# Patient Record
Sex: Male | Born: 1985 | Race: White | Hispanic: No | Marital: Single | Smoking: Never smoker
Health system: Southern US, Community
[De-identification: ages and names within clinical notes are randomized; demographics above are authoritative.]

## PROBLEM LIST (undated history)

## (undated) DIAGNOSIS — R569 Unspecified convulsions: Secondary | ICD-10-CM

## (undated) DIAGNOSIS — K654 Sclerosing mesenteritis: Secondary | ICD-10-CM

## (undated) DIAGNOSIS — M5020 Other cervical disc displacement, unspecified cervical region: Secondary | ICD-10-CM

## (undated) DIAGNOSIS — K2970 Gastritis, unspecified, without bleeding: Secondary | ICD-10-CM

## (undated) DIAGNOSIS — K2270 Barrett's esophagus without dysplasia: Secondary | ICD-10-CM

## (undated) HISTORY — PX: MANDIBLE SURGERY: SHX707

---

## 2015-08-02 ENCOUNTER — Encounter: Payer: Self-pay | Admitting: Physician Assistant

## 2015-08-02 ENCOUNTER — Ambulatory Visit (INDEPENDENT_AMBULATORY_CARE_PROVIDER_SITE_OTHER): Payer: Self-pay | Admitting: Family Medicine

## 2015-08-02 VITALS — BP 104/60 | HR 69 | Temp 97.9°F | Resp 18 | Ht 69.5 in | Wt 190.2 lb

## 2015-08-02 DIAGNOSIS — Y990 Civilian activity done for income or pay: Secondary | ICD-10-CM

## 2015-08-02 DIAGNOSIS — H5711 Ocular pain, right eye: Secondary | ICD-10-CM

## 2015-08-02 DIAGNOSIS — H1189 Other specified disorders of conjunctiva: Secondary | ICD-10-CM

## 2015-08-02 MED ORDER — OFLOXACIN 0.3 % OP SOLN: OPHTHALMIC | Status: DC

## 2015-08-02 NOTE — Progress Notes (Signed)
Patient ID: Don Dixon, male    DOB: 1986-05-27  Age: 30 y.o. MRN: [id number]  Chief Complaint  Patient presents with  . 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 He rinsed it out, but didn't have a good eye wash station. He continued to work. This morning his eye was matted shut with some sticky drainage.He came to the office for evaluation. His father's his boss and this was not filed on [employer] but is a work-related injury.  Current allergies, medications, problem list, past/family and social histories reviewed.  Objective:  BP 104/60 mmHg  Pulse 69  Temp(Src) 97.9 F (36.6 C) (Oral)  Resp 18  Ht 5' 9.5" (1.765 m)  Wt 190 lb 3.2 oz (86.274 kg)  BMI 27.69 kg/m2  SpO2 98%  Eyes PERRLA. Fundi benign.No foreign body noted. Lid everted andno foreign bodies noted. Eye was irrigated well after applying fluorescein.Blacklight examination did not reveal any abrasions. There is a little uptake on the lateral aspect of the conjunctiva.No lesion or foreign body noted there.  Assessment & Plan:   Assessment: 1. Eye pain, right   2. Work related injury   3. Conjunctival irritation       Plan: Will cover with antibiotics.Recheck at anytime if worse Plan to return in 2 days for a follow-up visit.  No orders of the defined types were placed in this encounter.    Meds ordered this encounter  Medications  . ofloxacin (OCUFLOX) 0.3 % ophthalmic solution    Sig: Se 1-2 drops in right eye every 3-4 hours as directed    Dispense:  5 mL    Refill:  0         Patient Instructions  Return Friday for a follow-up visit.  If worse or not improving at all by tomorrow morning please return tomorrow so we can see if referral to a specialist is needed before we get into the weekend  Use the ofloxacin eye drops1 or 2 drops in right eye every 3-4 hours  for 24 hours, then decrease to 4 times daily.      No Follow-up on file.   HOPPER,DAVID, MD 08/02/2015

## 2015-08-02 NOTE — Patient Instructions (Signed)
Return Friday for a follow-up visit.  If worse or not improving at all by tomorrow morning please return tomorrow so we can see if referral to a specialist is needed before we get into the weekend  Use the ofloxacin eye drops1 or 2 drops in right eye every 3-4 hours for 24 hours, then decrease to 4 times daily.

## 2021-10-16 ENCOUNTER — Encounter (HOSPITAL_BASED_OUTPATIENT_CLINIC_OR_DEPARTMENT_OTHER): Payer: Self-pay | Admitting: Emergency Medicine

## 2021-10-16 ENCOUNTER — Emergency Department (HOSPITAL_BASED_OUTPATIENT_CLINIC_OR_DEPARTMENT_OTHER): Payer: Non-veteran care

## 2021-10-16 ENCOUNTER — Other Ambulatory Visit: Payer: Self-pay | Admitting: General Surgery

## 2021-10-16 ENCOUNTER — Observation Stay (HOSPITAL_BASED_OUTPATIENT_CLINIC_OR_DEPARTMENT_OTHER)
Admission: EM | Admit: 2021-10-16 | Discharge: 2021-10-17 | Disposition: A | Discharge status: Home / Self Care | Payer: Non-veteran care | Attending: General Surgery | Admitting: General Surgery

## 2021-10-16 DIAGNOSIS — R1031 Right lower quadrant pain: Secondary | ICD-10-CM | POA: Diagnosis present

## 2021-10-16 DIAGNOSIS — K3580 Unspecified acute appendicitis: Principal | ICD-10-CM | POA: Insufficient documentation

## 2021-10-16 DIAGNOSIS — K37 Unspecified appendicitis: Secondary | ICD-10-CM

## 2021-10-16 HISTORY — DX: Barrett's esophagus without dysplasia: K22.70

## 2021-10-16 HISTORY — DX: Other cervical disc displacement, unspecified cervical region: M50.20

## 2021-10-16 HISTORY — DX: Gastritis, unspecified, without bleeding: K29.70

## 2021-10-16 HISTORY — DX: Unspecified convulsions: R56.9

## 2021-10-16 HISTORY — DX: Sclerosing mesenteritis: K65.4

## 2021-10-16 LAB — CBC WITH DIFFERENTIAL/PLATELET
Abs Immature Granulocytes: 0.03 10*3/uL (ref 0.00–0.07)
Basophils Absolute: 0 10*3/uL (ref 0.0–0.1)
Basophils Relative: 0 %
Eosinophils Absolute: 0.1 10*3/uL (ref 0.0–0.5)
Eosinophils Relative: 1 %
HCT: 42.2 % (ref 39.0–52.0)
Hemoglobin: 14.8 g/dL (ref 13.0–17.0)
Immature Granulocytes: 0 %
Lymphocytes Relative: 13 %
Lymphs Abs: 1.8 10*3/uL (ref 0.7–4.0)
MCH: 30.3 pg (ref 26.0–34.0)
MCHC: 35.1 g/dL (ref 30.0–36.0)
MCV: 86.3 fL (ref 80.0–100.0)
Monocytes Absolute: 1 10*3/uL (ref 0.1–1.0)
Monocytes Relative: 7 %
Neutro Abs: 11.7 10*3/uL — ABNORMAL HIGH (ref 1.7–7.7)
Neutrophils Relative %: 79 %
Platelets: 232 10*3/uL (ref 150–400)
RBC: 4.89 MIL/uL (ref 4.22–5.81)
RDW: 12 % (ref 11.5–15.5)
WBC: 14.6 10*3/uL — ABNORMAL HIGH (ref 4.0–10.5)
nRBC: 0 % (ref 0.0–0.2)

## 2021-10-16 LAB — COMPREHENSIVE METABOLIC PANEL
ALT: 25 U/L (ref 0–44)
AST: 20 U/L (ref 15–41)
Albumin: 4.4 g/dL (ref 3.5–5.0)
Alkaline Phosphatase: 87 U/L (ref 38–126)
Anion gap: 7 (ref 5–15)
BUN: 17 mg/dL (ref 6–20)
CO2: 27 mmol/L (ref 22–32)
Calcium: 9 mg/dL (ref 8.9–10.3)
Chloride: 104 mmol/L (ref 98–111)
Creatinine, Ser: 1.27 mg/dL — ABNORMAL HIGH (ref 0.61–1.24)
GFR, Estimated: >60 mL/min (ref >60)
Glucose, Bld: 96 mg/dL (ref 70–99)
Potassium: 4.2 mmol/L (ref 3.5–5.1)
Sodium: 138 mmol/L (ref 135–145)
Total Bilirubin: 0.6 mg/dL (ref 0.3–1.2)
Total Protein: 7.5 g/dL (ref 6.5–8.1)

## 2021-10-16 LAB — LIPASE, BLOOD: Lipase: 30 U/L (ref 11–51)

## 2021-10-16 MED ORDER — CEFTRIAXONE SODIUM 2 G IJ SOLR
2.0000 g | Freq: Once | INTRAMUSCULAR | Status: AC
Start: 2021-10-16 — End: 2021-10-17
  Administered 2021-10-16: 2 g via INTRAVENOUS
  Filled 2021-10-16: qty 20

## 2021-10-16 MED ORDER — LACTATED RINGERS IV BOLUS
1000.0000 mL | Freq: Once | INTRAVENOUS | Status: AC
Start: 2021-10-16 — End: 2021-10-16
  Administered 2021-10-16: 1000 mL via INTRAVENOUS

## 2021-10-16 MED ORDER — IOHEXOL 300 MG/ML  SOLN
100.0000 mL | Freq: Once | INTRAMUSCULAR | Status: AC | PRN
Start: 2021-10-16 — End: 2021-10-16
  Administered 2021-10-16: 100 mL via INTRAVENOUS

## 2021-10-16 MED ORDER — LACTATED RINGERS IV BOLUS
1000.0000 mL | Freq: Once | INTRAVENOUS | Status: AC
  Administered 2021-10-16: 1000 mL via INTRAVENOUS

## 2021-10-16 MED ORDER — ONDANSETRON HCL 4 MG/2ML IJ SOLN
4.0000 mg | Freq: Once | INTRAMUSCULAR | Status: AC
  Administered 2021-10-16: 4 mg via INTRAVENOUS
  Filled 2021-10-16: qty 2

## 2021-10-16 MED ORDER — METRONIDAZOLE 500 MG/100ML IV SOLN
500.0000 mg | Freq: Three times a day (TID) | INTRAVENOUS | Status: DC
Start: 2021-10-16 — End: 2021-10-17
  Administered 2021-10-17: 500 mg via INTRAVENOUS
  Filled 2021-10-16 (×2): qty 100

## 2021-10-16 MED ORDER — HYDROMORPHONE HCL 1 MG/ML IJ SOLN
1.0000 mg | Freq: Once | INTRAMUSCULAR | Status: AC
  Administered 2021-10-16: 1 mg via INTRAVENOUS
  Filled 2021-10-16: qty 1

## 2021-10-16 NOTE — ED Provider Notes (Signed)
?[facility] EMERGENCY DEPARTMENT ?Provider Note ? ? ?CSN: [id number] ?Arrival date & time: 10/16/21  2102 ? ?  ? ?History ?Chief Complaint  ?Patient presents with  ? Abdominal Pain  ? ? ?Don Dixon is a 36 y.o. male with history of gastritis, sclerosing mesentery-itis presents emergency department for evaluation of right lower quadrant pain onset 1000 today.  Patient reports he had some nausea without any vomiting.  Denies any dysuria hematuria.  He reports has been constipated for the past few days but denies any diarrhea.  Patient is able to pass gas.  Denies any fever, chest pain, or shortness of breath.  No medication taken prior to arrival for pain.  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  He has had endoscopies and jaw surgery, but denies any abdominal surgeries. ? ? ?Abdominal Pain ?Associated symptoms: constipation and nausea   ?Associated symptoms: no chest pain, no chills, no diarrhea, no dysuria, no fever, no hematuria, no shortness of breath and no vomiting   ? ?  ? ?Home Medications ?Prior to Admission medications   ?Medication Sig Start Date End Date Taking? Authorizing Provider  ?ofloxacin (OCUFLOX) 0.3 % ophthalmic solution Se 1-2 drops in right eye every 3-4 hours as directed 08/02/15   Don Najjar, MD  ?   ? ?Allergies    ?Patient has no known allergies.   ? ?Review of Systems   ?Review of Systems  ?Constitutional:  Negative for chills and fever.  ?Respiratory:  Negative for shortness of breath.   ?Cardiovascular:  Negative for chest pain.  ?Gastrointestinal:  Positive for abdominal pain, constipation and nausea. Negative for diarrhea and vomiting.  ?Genitourinary:  Negative for dysuria and hematuria.  ?Musculoskeletal:  Negative for back pain.  ? ?Physical Exam ?Updated Vital Signs ?BP (!) 135/91   Pulse 75   Temp 98.5 ?F (36.9 ?C) (Oral)   Resp 18   Wt 90.7 kg   SpO2 100%   BMI 29.11 kg/m?  ?Physical Exam ?Vitals and nursing note  reviewed.  ?Constitutional:   ?   Appearance: Normal appearance. He is ill-appearing.  ?HENT:  ?   Head: Normocephalic and atraumatic.  ?Eyes:  ?   General: No scleral icterus. ?Cardiovascular:  ?   Rate and Rhythm: Normal rate and regular rhythm.  ?Pulmonary:  ?   Effort: Pulmonary effort is normal. No respiratory distress.  ?   Breath sounds: Normal breath sounds.  ?Abdominal:  ?   General: Bowel sounds are normal. There is no distension.  ?   Palpations: Abdomen is soft.  ?   Tenderness: There is abdominal tenderness. There is guarding. There is no rebound. Negative signs include Rovsing's sign.  ?   Comments: Diffuse abdominal tenderness but mainly over the right upper and right lower quadrant.  There is voluntary guarding.  No rebound tenderness pain.  Normal active bowel sounds.  No overlying skin changes noted other than tattoo.  I did not visualize any abdominal surgical scars.  ?Musculoskeletal:     ?   General: No deformity.  ?   Cervical back: Normal range of motion.  ?Skin: ?   General: Skin is warm and dry.  ?Neurological:  ?   General: No focal deficit present.  ?   Mental Status: He is alert. Mental status is at baseline.  ? ? ?ED Results / Procedures / Treatments   ?Labs ?(all labs ordered are listed, but only abnormal results are  displayed) ?Labs Reviewed  ?COMPREHENSIVE METABOLIC PANEL - Abnormal; Notable for the following components:  ?    Result Value  ? Creatinine, Ser 1.27 (*)   ? All other components within normal limits  ?CBC WITH DIFFERENTIAL/PLATELET - Abnormal; Notable for the following components:  ? WBC 14.6 (*)   ? Neutro Abs 11.7 (*)   ? All other components within normal limits  ?LIPASE, BLOOD  ? ? ?EKG ?None ? ?Radiology ?CT ABDOMEN PELVIS W CONTRAST ? ?Result Date: 10/16/2021 ?CLINICAL DATA:  Right-sided abdominal pain, nausea since this morning, right lower quadrant tenderness EXAM: CT ABDOMEN AND PELVIS WITH CONTRAST TECHNIQUE: Multidetector CT imaging of the abdomen and pelvis was  performed using the standard protocol following bolus administration of intravenous contrast. RADIATION DOSE REDUCTION: This exam was performed according to the departmental dose-optimization program which includes automated exposure control, adjustment of the mA and/or kV according to patient size and/or use of iterative reconstruction technique. CONTRAST:  100mL OMNIPAQUE IOHEXOL 300 MG/ML  SOLN COMPARISON:  None. FINDINGS: Lower chest: No acute pleural or parenchymal lung disease. Hepatobiliary: Small hepatic hypodensities most compatible with cysts. No biliary duct dilation. The gallbladder is unremarkable. Pancreas: Unremarkable. No pancreatic ductal dilatation or surrounding inflammatory changes. Spleen: Normal in size without focal abnormality. Adrenals/Urinary Tract: Nonobstructing 2 mm right renal calculus reference image 32/2. No left-sided calculi. No obstructive uropathy within either kidney. The adrenals and bladder are unremarkable. Stomach/Bowel: Dilated appendix right lower quadrant measures 12 mm, with multiple small appendicoliths and periappendiceal fat stranding consistent with acute appendicitis. No perforation, fluid collection, or abscess. No bowel obstruction or ileus.  Small hiatal hernia. Vascular/Lymphatic: No significant vascular findings. No discrete pathologic adenopathy within the abdomen or pelvis. Reproductive: Prostate is unremarkable. Other: There is a multilobular mass within the central mesentery, and measuring up to 4.0 x 2.9 x 7.8 cm. The mass is multilobular and circumscribed, without associated desmoplastic reaction or bowel retraction. No free fluid or free intraperitoneal gas. No abdominal wall hernia. Musculoskeletal: No acute or destructive bony lesions. Reconstructed images demonstrate no additional findings. IMPRESSION: 1. Acute uncomplicated appendicitis, with multiple appendicoliths. No perforation, fluid collection, or abscess. 2. Nonobstructing 2 mm right renal  calculus. 3. 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 No desmoplastic reaction or bowel retraction. This may reflect a calcified fibrous tumor of the mesentery, though carcinoid tumor and lymphoma cannot be excluded. Direct visualization and possible biopsy could be performed time of appendectomy. 4. Small hiatal hernia. Electronically Signed   By: Sharlet SalinaMichael  Brown M.D.   On: 10/16/2021 22:30   ? ?Procedures ?Procedures  ? ?Medications Ordered in ED ?Medications  ?cefTRIAXone (ROCEPHIN) 2 g in sodium chloride 0.9 % 100 mL IVPB (has no administration in time range)  ?metroNIDAZOLE (FLAGYL) IVPB 500 mg (has no administration in time range)  ?iohexol (OMNIPAQUE) 300 MG/ML solution 100 mL (100 mLs Intravenous Contrast Given 10/16/21 2156)  ?lactated ringers bolus 1,000 mL (1,000 mLs Intravenous New Bag/Given 10/16/21 2226)  ?lactated ringers bolus 1,000 mL (1,000 mLs Intravenous New Bag/Given 10/16/21 2307)  ?HYDROmorphone (DILAUDID) injection 1 mg (1 mg Intravenous Given 10/16/21 2305)  ?ondansetron [facility](ZOFRAN) injection 4 mg (4 mg Intravenous Given 10/16/21 2306)  ? ? ?ED Course/ Medical Decision Making/ A&P ?  ?                        ?Medical Decision Making ?Amount and/or Complexity of Data Reviewed ?Labs: ordered. ?Radiology: ordered. ? ?Risk ?Prescription drug management. ?  Decision regarding hospitalization. ? ? ?Don Dixon is a 36 year old male presenting to the emergency department for evaluation of right lower quadrant pain starting this morning around 1000.  Differential diagnosis includes was not limited to diverticulitis, constipation, SBO, appendicitis, viral gastroenteritis.  Vital signs show mild hypertension at 138/95, afebrile, normal pulse rate, satting well on room air without any increased work of breathing.  Not concerned for sepsis given vitals.  Physical exam is pertinent for diffuse abdominal tenderness but mainly over the right upper and right lower quadrant.  There is  voluntary guarding.  No rebound tenderness pain.  Normal active bowel sounds.  No overlying skin changes noted other than tattoo.  I did not visualize any abdominal surgical scars. ? ?We will order labs and CT imagi

## 2021-10-16 NOTE — ED Triage Notes (Signed)
Pt having rightsided pain that started this morning and has progressed in pain throughout day. He endorses nausea. No diarrhea. Last BM was this morning. Hx of esophogeal issues and hiatal hernia ?

## 2021-10-17 ENCOUNTER — Other Ambulatory Visit: Payer: Self-pay

## 2021-10-17 ENCOUNTER — Encounter (HOSPITAL_COMMUNITY)
Admission: EM | Disposition: A | Discharge status: Home / Self Care | Payer: Self-pay | Source: Home / Self Care | Attending: Emergency Medicine

## 2021-10-17 ENCOUNTER — Observation Stay (HOSPITAL_BASED_OUTPATIENT_CLINIC_OR_DEPARTMENT_OTHER): Payer: Non-veteran care | Admitting: Anesthesiology

## 2021-10-17 ENCOUNTER — Encounter (HOSPITAL_COMMUNITY): Payer: Self-pay

## 2021-10-17 ENCOUNTER — Observation Stay (HOSPITAL_COMMUNITY): Payer: Non-veteran care | Admitting: Anesthesiology

## 2021-10-17 DIAGNOSIS — R1031 Right lower quadrant pain: Secondary | ICD-10-CM | POA: Diagnosis present

## 2021-10-17 DIAGNOSIS — K3580 Unspecified acute appendicitis: Secondary | ICD-10-CM | POA: Diagnosis present

## 2021-10-17 DIAGNOSIS — K37 Unspecified appendicitis: Secondary | ICD-10-CM | POA: Diagnosis present

## 2021-10-17 HISTORY — PX: LAPAROSCOPIC APPENDECTOMY: SHX408

## 2021-10-17 LAB — CBC
HCT: 38.7 % — ABNORMAL LOW (ref 39.0–52.0)
Hemoglobin: 13.4 g/dL (ref 13.0–17.0)
MCH: 30 pg (ref 26.0–34.0)
MCHC: 34.6 g/dL (ref 30.0–36.0)
MCV: 86.6 fL (ref 80.0–100.0)
Platelets: 178 10*3/uL (ref 150–400)
RBC: 4.47 MIL/uL (ref 4.22–5.81)
RDW: 12.1 % (ref 11.5–15.5)
WBC: 14.5 10*3/uL — ABNORMAL HIGH (ref 4.0–10.5)
nRBC: 0 % (ref 0.0–0.2)

## 2021-10-17 LAB — BASIC METABOLIC PANEL
Anion gap: 7 (ref 5–15)
BUN: 9 mg/dL (ref 6–20)
CO2: 25 mmol/L (ref 22–32)
Calcium: 8.4 mg/dL — ABNORMAL LOW (ref 8.9–10.3)
Chloride: 103 mmol/L (ref 98–111)
Creatinine, Ser: 1.02 mg/dL (ref 0.61–1.24)
GFR, Estimated: >60 mL/min (ref >60)
Glucose, Bld: 116 mg/dL — ABNORMAL HIGH (ref 70–99)
Potassium: 3.8 mmol/L (ref 3.5–5.1)
Sodium: 135 mmol/L (ref 135–145)

## 2021-10-17 LAB — SURGICAL PCR SCREEN
MRSA, PCR: NEGATIVE
Staphylococcus aureus: NEGATIVE

## 2021-10-17 SURGERY — APPENDECTOMY, LAPAROSCOPIC
Anesthesia: General | Site: Abdomen

## 2021-10-17 MED ORDER — PROCHLORPERAZINE MALEATE 10 MG PO TABS
10.0000 mg | ORAL_TABLET | Freq: Four times a day (QID) | ORAL | Status: DC | PRN
  Filled 2021-10-17: qty 1

## 2021-10-17 MED ORDER — SODIUM CHLORIDE 0.9 % IV SOLN: INTRAVENOUS | Status: DC

## 2021-10-17 MED ORDER — CLONIDINE HCL (ANALGESIA) 100 MCG/ML EP SOLN
EPIDURAL | Status: DC | PRN
  Administered 2021-10-17: 80 ug

## 2021-10-17 MED ORDER — MIDAZOLAM HCL 2 MG/2ML IJ SOLN
INTRAMUSCULAR | Status: AC
  Administered 2021-10-17: 1 mg via INTRAVENOUS
  Filled 2021-10-17: qty 2

## 2021-10-17 MED ORDER — ACETAMINOPHEN 650 MG RE SUPP: 650.0000 mg | Freq: Four times a day (QID) | RECTAL | Status: DC | PRN

## 2021-10-17 MED ORDER — ROCURONIUM BROMIDE 100 MG/10ML IV SOLN
INTRAVENOUS | Status: DC | PRN
  Administered 2021-10-17: 50 mg via INTRAVENOUS
  Administered 2021-10-17: 10 mg via INTRAVENOUS

## 2021-10-17 MED ORDER — KCL-LACTATED RINGERS-D5W 20 MEQ/L IV SOLN
INTRAVENOUS | Status: DC
Start: 2021-10-17 — End: 2021-10-17
  Filled 2021-10-17: qty 1000

## 2021-10-17 MED ORDER — LACTATED RINGERS IV SOLN: INTRAVENOUS | Status: DC

## 2021-10-17 MED ORDER — BUPIVACAINE-EPINEPHRINE 0.25% -1:200000 IJ SOLN
INTRAMUSCULAR | Status: DC | PRN
  Administered 2021-10-17: 6 mL

## 2021-10-17 MED ORDER — FENTANYL CITRATE (PF) 100 MCG/2ML IJ SOLN: 50.0000 ug | Freq: Once | INTRAMUSCULAR | Status: AC

## 2021-10-17 MED ORDER — BUPIVACAINE-EPINEPHRINE (PF) 0.25% -1:200000 IJ SOLN
INTRAMUSCULAR | Status: AC
  Filled 2021-10-17: qty 30

## 2021-10-17 MED ORDER — PHENYLEPHRINE HCL (PRESSORS) 10 MG/ML IV SOLN
INTRAVENOUS | Status: DC | PRN
  Administered 2021-10-17: 160 ug via INTRAVENOUS
  Administered 2021-10-17 (×3): 80 ug via INTRAVENOUS

## 2021-10-17 MED ORDER — SUGAMMADEX SODIUM 200 MG/2ML IV SOLN
INTRAVENOUS | Status: DC | PRN
  Administered 2021-10-17: 200 mg via INTRAVENOUS

## 2021-10-17 MED ORDER — CHLORHEXIDINE GLUCONATE 0.12 % MT SOLN: 15.0000 mL | Freq: Once | OROMUCOSAL | Status: AC

## 2021-10-17 MED ORDER — ONDANSETRON 4 MG PO TBDP: 4.0000 mg | ORAL_TABLET | Freq: Three times a day (TID) | ORAL | Status: DC | PRN

## 2021-10-17 MED ORDER — METRONIDAZOLE 500 MG/100ML IV SOLN
500.0000 mg | Freq: Two times a day (BID) | INTRAVENOUS | Status: DC
  Administered 2021-10-17: 500 mg via INTRAVENOUS

## 2021-10-17 MED ORDER — PROPOFOL 10 MG/ML IV BOLUS
INTRAVENOUS | Status: DC | PRN
  Administered 2021-10-17: 200 mg via INTRAVENOUS

## 2021-10-17 MED ORDER — DIPHENHYDRAMINE HCL 12.5 MG/5ML PO ELIX: 12.5000 mg | ORAL_SOLUTION | Freq: Four times a day (QID) | ORAL | Status: DC | PRN

## 2021-10-17 MED ORDER — LIDOCAINE HCL (CARDIAC) PF 100 MG/5ML IV SOSY
PREFILLED_SYRINGE | INTRAVENOUS | Status: DC | PRN
  Administered 2021-10-17: 80 mg via INTRATRACHEAL

## 2021-10-17 MED ORDER — DEXAMETHASONE SODIUM PHOSPHATE 4 MG/ML IJ SOLN
INTRAMUSCULAR | Status: DC | PRN
  Administered 2021-10-17: 5 mg

## 2021-10-17 MED ORDER — MELATONIN 3 MG PO TABS: 3.0000 mg | ORAL_TABLET | Freq: Every evening | ORAL | Status: DC | PRN

## 2021-10-17 MED ORDER — EPHEDRINE 5 MG/ML INJ
INTRAVENOUS | Status: AC
  Filled 2021-10-17: qty 5

## 2021-10-17 MED ORDER — HYDROMORPHONE HCL 1 MG/ML IJ SOLN
0.5000 mg | INTRAMUSCULAR | Status: DC | PRN
  Administered 2021-10-17 (×4): 0.5 mg via INTRAVENOUS
  Filled 2021-10-17: qty 1
  Filled 2021-10-17 (×3): qty 0.5

## 2021-10-17 MED ORDER — TRAMADOL HCL 50 MG PO TABS: 100.0000 mg | ORAL_TABLET | Freq: Four times a day (QID) | ORAL | 0 refills | Status: AC | PRN

## 2021-10-17 MED ORDER — ALBUMIN HUMAN 5 % IV SOLN: INTRAVENOUS | Status: DC | PRN

## 2021-10-17 MED ORDER — FENTANYL CITRATE (PF) 100 MCG/2ML IJ SOLN
INTRAMUSCULAR | Status: AC
  Administered 2021-10-17: 100 ug
  Filled 2021-10-17: qty 2

## 2021-10-17 MED ORDER — MORPHINE SULFATE (PF) 2 MG/ML IV SOLN: 1.0000 mg | INTRAVENOUS | Status: DC | PRN

## 2021-10-17 MED ORDER — SENNA 8.6 MG PO TABS: 1.0000 | ORAL_TABLET | Freq: Two times a day (BID) | ORAL | Status: DC

## 2021-10-17 MED ORDER — ONDANSETRON 4 MG PO TBDP: 4.0000 mg | ORAL_TABLET | Freq: Four times a day (QID) | ORAL | Status: DC | PRN

## 2021-10-17 MED ORDER — FENTANYL CITRATE (PF) 250 MCG/5ML IJ SOLN
INTRAMUSCULAR | Status: AC
  Filled 2021-10-17: qty 5

## 2021-10-17 MED ORDER — FENTANYL CITRATE (PF) 250 MCG/5ML IJ SOLN
INTRAMUSCULAR | Status: DC | PRN
  Administered 2021-10-17: 100 ug via INTRAVENOUS

## 2021-10-17 MED ORDER — MIDAZOLAM HCL 2 MG/2ML IJ SOLN
INTRAMUSCULAR | Status: AC
  Filled 2021-10-17: qty 2

## 2021-10-17 MED ORDER — OXYCODONE HCL 5 MG PO TABS: 5.0000 mg | ORAL_TABLET | ORAL | Status: DC | PRN

## 2021-10-17 MED ORDER — ORAL CARE MOUTH RINSE: 15.0000 mL | Freq: Once | OROMUCOSAL | Status: AC

## 2021-10-17 MED ORDER — MIDAZOLAM HCL 5 MG/5ML IJ SOLN
INTRAMUSCULAR | Status: DC | PRN
  Administered 2021-10-17 (×2): 1 mg via INTRAVENOUS

## 2021-10-17 MED ORDER — BUPIVACAINE-EPINEPHRINE (PF) 0.5% -1:200000 IJ SOLN
INTRAMUSCULAR | Status: DC | PRN
  Administered 2021-10-17: 30 mL

## 2021-10-17 MED ORDER — DEXAMETHASONE SODIUM PHOSPHATE 10 MG/ML IJ SOLN
INTRAMUSCULAR | Status: AC
  Filled 2021-10-17: qty 1

## 2021-10-17 MED ORDER — METHOCARBAMOL 500 MG PO TABS: 500.0000 mg | ORAL_TABLET | Freq: Four times a day (QID) | ORAL | Status: DC | PRN

## 2021-10-17 MED ORDER — ENOXAPARIN SODIUM 40 MG/0.4ML IJ SOSY
40.0000 mg | PREFILLED_SYRINGE | INTRAMUSCULAR | Status: DC
Start: 2021-10-17 — End: 2021-10-17
  Administered 2021-10-17: 40 mg via SUBCUTANEOUS
  Filled 2021-10-17: qty 0.4

## 2021-10-17 MED ORDER — MIDAZOLAM HCL 2 MG/2ML IJ SOLN: 1.0000 mg | Freq: Once | INTRAMUSCULAR | Status: AC

## 2021-10-17 MED ORDER — ONDANSETRON HCL 4 MG/2ML IJ SOLN
INTRAMUSCULAR | Status: AC
  Filled 2021-10-17: qty 2

## 2021-10-17 MED ORDER — DIPHENHYDRAMINE HCL 50 MG/ML IJ SOLN: 12.5000 mg | Freq: Four times a day (QID) | INTRAMUSCULAR | Status: DC | PRN

## 2021-10-17 MED ORDER — ONDANSETRON HCL 4 MG/2ML IJ SOLN
4.0000 mg | Freq: Four times a day (QID) | INTRAMUSCULAR | Status: DC | PRN
Start: 2021-10-17 — End: 2021-10-17
  Administered 2021-10-17: 4 mg via INTRAVENOUS

## 2021-10-17 MED ORDER — PROCHLORPERAZINE EDISYLATE 10 MG/2ML IJ SOLN: 5.0000 mg | Freq: Four times a day (QID) | INTRAMUSCULAR | Status: DC | PRN

## 2021-10-17 MED ORDER — PROPOFOL 10 MG/ML IV BOLUS
INTRAVENOUS | Status: AC
Start: 2021-10-17 — End: ?
  Filled 2021-10-17: qty 20

## 2021-10-17 MED ORDER — CHLORHEXIDINE GLUCONATE 0.12 % MT SOLN
OROMUCOSAL | Status: AC
  Administered 2021-10-17: 15 mL via OROMUCOSAL
  Filled 2021-10-17: qty 15

## 2021-10-17 MED ORDER — SODIUM CHLORIDE 0.9 % IV SOLN: 2.0000 g | INTRAVENOUS | Status: DC

## 2021-10-17 MED ORDER — FENTANYL CITRATE PF 50 MCG/ML IJ SOSY: 50.0000 ug | PREFILLED_SYRINGE | Freq: Once | INTRAMUSCULAR | Status: DC

## 2021-10-17 MED ORDER — DEXAMETHASONE SODIUM PHOSPHATE 10 MG/ML IJ SOLN
INTRAMUSCULAR | Status: DC | PRN
  Administered 2021-10-17: 10 mg via INTRAVENOUS

## 2021-10-17 MED ORDER — SODIUM CHLORIDE 0.9 % IR SOLN
Status: DC | PRN
  Administered 2021-10-17: 1000 mL

## 2021-10-17 MED ORDER — ACETAMINOPHEN 325 MG PO TABS
650.0000 mg | ORAL_TABLET | Freq: Four times a day (QID) | ORAL | Status: DC | PRN
  Administered 2021-10-17: 650 mg via ORAL
  Filled 2021-10-17: qty 2

## 2021-10-17 MED ORDER — TRAMADOL HCL 50 MG PO TABS
100.0000 mg | ORAL_TABLET | Freq: Four times a day (QID) | ORAL | Status: DC | PRN
  Administered 2021-10-17: 100 mg via ORAL
  Filled 2021-10-17: qty 2

## 2021-10-17 MED ORDER — 0.9 % SODIUM CHLORIDE (POUR BTL) OPTIME
TOPICAL | Status: DC | PRN
  Administered 2021-10-17: 1000 mL

## 2021-10-17 SURGICAL SUPPLY — 42 items
CANISTER SUCT 3000ML PPV (MISCELLANEOUS) ×2 IMPLANT
CHLORAPREP W/TINT 26 (MISCELLANEOUS) ×2 IMPLANT
COVER SURGICAL LIGHT HANDLE (MISCELLANEOUS) ×2 IMPLANT
CUTTER FLEX LINEAR 45M (STAPLE) ×2 IMPLANT
DERMABOND ADVANCED (GAUZE/BANDAGES/DRESSINGS) ×1
DERMABOND ADVANCED .7 DNX12 (GAUZE/BANDAGES/DRESSINGS) ×1 IMPLANT
ELECT REM PT RETURN 9FT ADLT (ELECTROSURGICAL) ×2
ELECTRODE REM PT RTRN 9FT ADLT (ELECTROSURGICAL) ×1 IMPLANT
GLOVE BIO SURGEON STRL SZ7 (GLOVE) ×2 IMPLANT
GLOVE BIOGEL PI IND STRL 7.5 (GLOVE) ×1 IMPLANT
GLOVE BIOGEL PI INDICATOR 7.5 (GLOVE) ×1
GOWN STRL REUS W/ TWL LRG LVL3 (GOWN DISPOSABLE) ×3 IMPLANT
GOWN STRL REUS W/TWL LRG LVL3 (GOWN DISPOSABLE) ×3
GRASPER SUT TROCAR 14GX15 (MISCELLANEOUS) ×2 IMPLANT
IRRIG SUCT STRYKERFLOW 2 WTIP (MISCELLANEOUS) ×2
IRRIGATION SUCT STRKRFLW 2 WTP (MISCELLANEOUS) IMPLANT
KIT BASIN OR (CUSTOM PROCEDURE TRAY) ×2 IMPLANT
KIT TURNOVER KIT B (KITS) ×2 IMPLANT
NS IRRIG 1000ML POUR BTL (IV SOLUTION) ×2 IMPLANT
PAD ARMBOARD 7.5X6 YLW CONV (MISCELLANEOUS) ×4 IMPLANT
POUCH RETRIEVAL ECOSAC 10 (ENDOMECHANICALS) ×1 IMPLANT
POUCH RETRIEVAL ECOSAC 10MM (ENDOMECHANICALS) ×1
RELOAD 45 VASCULAR/THIN (ENDOMECHANICALS) IMPLANT
RELOAD STAPLE 45 2.5 WHT GRN (ENDOMECHANICALS) IMPLANT
RELOAD STAPLE 45 3.5 BLU ETS (ENDOMECHANICALS) IMPLANT
RELOAD STAPLE TA45 3.5 REG BLU (ENDOMECHANICALS) ×2 IMPLANT
SCISSORS LAP 5X35 DISP (ENDOMECHANICALS) IMPLANT
SET IRRIG TUBING LAPAROSCOPIC (IRRIGATION / IRRIGATOR) ×2 IMPLANT
SET TUBE SMOKE EVAC HIGH FLOW (TUBING) ×2 IMPLANT
SHEARS HARMONIC ACE PLUS 36CM (ENDOMECHANICALS) ×2 IMPLANT
SLEEVE ENDOPATH XCEL 5M (ENDOMECHANICALS) ×2 IMPLANT
SPECIMEN JAR SMALL (MISCELLANEOUS) ×2 IMPLANT
STRIP CLOSURE SKIN 1/2X4 (GAUZE/BANDAGES/DRESSINGS) ×2 IMPLANT
SUT MNCRL AB 4-0 PS2 18 (SUTURE) ×2 IMPLANT
SUT VICRYL 0 UR6 27IN ABS (SUTURE) ×3 IMPLANT
TOWEL GREEN STERILE (TOWEL DISPOSABLE) ×2 IMPLANT
TOWEL GREEN STERILE FF (TOWEL DISPOSABLE) ×2 IMPLANT
TRAY FOLEY MTR SLVR 16FR STAT (SET/KITS/TRAYS/PACK) IMPLANT
TRAY LAPAROSCOPIC MC (CUSTOM PROCEDURE TRAY) ×2 IMPLANT
TROCAR XCEL BLUNT TIP 100MML (ENDOMECHANICALS) ×2 IMPLANT
TROCAR XCEL NON-BLD 5MMX100MML (ENDOMECHANICALS) ×2 IMPLANT
WATER STERILE IRR 1000ML POUR (IV SOLUTION) ×2 IMPLANT

## 2021-10-17 NOTE — Plan of Care (Signed)
?  Problem: Activity: ?Goal: Risk for activity intolerance will decrease ?Outcome: Progressing ?  ?Problem: Coping: ?Goal: Level of anxiety will decrease ?Outcome: Progressing ?  ?Problem: Pain Managment: ?Goal: General experience of comfort will improve ?Outcome: Progressing ?  ?Problem: Elimination: ?Goal: Will not experience complications related to bowel motility ?Outcome: Progressing ?Goal: Will not experience complications related to urinary retention ?Outcome: Progressing ?  ?

## 2021-10-17 NOTE — Op Note (Signed)
Preoperative diagnosis: Acute appendicitis ?Postoperative diagnosis: Same as above ?Procedure: Laparoscopic appendectomy ?Surgeon: Dr. Serita Grammes ?Anesthesia: General ?Estimated blood loss: Minimal ?Specimens: Appendix to pathology ?Drains: None ?Complications: None ?Sponge needle count was correct completion ?Disposition to recovery in stable condition ? ?Indications: 40 yof with 24 hours of periumbilical pain migrated to rlq.  Nothing helping at home, voiding, no bm. No fever.  Went to outside er and found to have ct scan with appendicitis and transferred here. ?He has history of [place], is medically retired from [place]. 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  we discussed lap appy. ? ?Procedure: After informed consent was obtained he was taken to the OR. He had already received antibiotics.  SCDs were in place.  He was then placed under general anesthesia without complication.  He was prepped and draped in the standard sterile surgical fashion.  A surgical timeout was then performed. ? ?I infiltrated Marcaine below her umbilicus.  I made a vertical incision.  I grasped the fascia and incised this sharply.  I entered the peritoneum bluntly.  I then placed a 0 Vicryl pursestring suture through the fascia.  I then placed a Hassan trocar.  I insufflated the abdomen to 15 mmHg pressure. I then placed two additional 5 mm trocars in the left lower quadrant and the suprapubic region.  I was able to identify the appendix and he had acute nonperforated appendicitis. There was a lot of inflammation at this site and was difficult to identity. I was able to identify the terminal ileum and cecum.  I then divided the appendiceal mesentery with the harmonic scalpel.  I then applied the GIA staper to the base and divided the appendix at the cecum. This was placed in a retrieval bag and removed from abdomen. Hemostasis was observed. I removed  the hasson trocar and tied the pursestring down.  I placed an additional 0 vicryl suture with the suture passer to obliterate that defect. I then desufflated the abdomen and removed the trocars. These were closed with 4-0 monocryl and glue. He was extubated, tolerated this well and transferred to pacu.  ?

## 2021-10-17 NOTE — Discharge Instructions (Signed)
[facility]  Please arrive at least 30 min before your appointment to complete your check in paperwork.  If you are unable to arrive 30 min prior to your appointment time we may have to cancel or reschedule you. LAPAROSCOPIC SURGERY: POST OP INSTRUCTIONS Always review your discharge instruction sheet given to you by the facility where your surgery was performed. IF YOU HAVE DISABILITY OR FAMILY LEAVE FORMS, YOU MUST BRING THEM TO THE OFFICE FOR PROCESSING.   DO NOT GIVE THEM TO YOUR DOCTOR.  PAIN CONTROL  First take acetaminophen (Tylenol) AND/or ibuprofen (Advil) to control your pain after surgery.  Follow directions on package.  Taking acetaminophen (Tylenol) and/or ibuprofen (Advil) regularly after surgery will help to control your pain and lower the amount of prescription pain medication you may need.  You should not take more than 4,000 mg (4 grams) of acetaminophen (Tylenol) in 24 hours.  You should not take ibuprofen (Advil), aleve, motrin, naprosyn or other NSAIDS if you have a history of stomach ulcers or chronic kidney disease.  A prescription for pain medication may be given to you upon discharge.  Take your pain medication as prescribed, if you still have uncontrolled pain after taking acetaminophen (Tylenol) or ibuprofen (Advil). Use ice packs to help control pain. If you need a refill on your pain medication, please contact your pharmacy.  They will contact our office to request authorization. Prescriptions will not be filled after 5pm or on week-ends.  HOME MEDICATIONS Take your usually prescribed medications unless otherwise directed.  DIET You should follow a light diet the first few days after arrival home.  Be sure to include lots of fluids daily. Avoid fatty, fried foods.   CONSTIPATION It is common to experience some constipation after surgery and if you are taking pain medication.  Increasing fluid intake and taking a stool softener (such as Colace)  will usually help or prevent this problem from occurring.  A mild laxative (Milk of Magnesia or Miralax) should be taken according to package instructions if there are no bowel movements after 48 hours.  WOUND/INCISION CARE Most patients will experience some swelling and bruising in the area of the incisions.  Ice packs will help.  Swelling and bruising can take several days to resolve.  Unless discharge instructions indicate otherwise, follow guidelines below  STERI-STRIPS - you may remove your outer bandages 48 hours after surgery, and you may shower at that time.  You have steri-strips (small skin tapes) in place directly over the incision.  These strips should be left on the skin for 7-10 days.   DERMABOND/SKIN GLUE - you may shower in 24 hours.  The glue will flake off over the next 2-3 weeks. Any sutures or staples will be removed at the office during your follow-up visit.  ACTIVITIES You may resume regular (light) daily activities beginning the next day--such as daily self-care, walking, climbing stairs--gradually increasing activities as tolerated.  You may have sexual intercourse when it is comfortable.  Refrain from any heavy lifting or straining until approved by your doctor. You may drive when you are no longer taking prescription pain medication, you can comfortably wear a seatbelt, and you can safely maneuver your car and apply brakes.  FOLLOW-UP You should see your doctor in the office for a follow-up appointment approximately 2-3 weeks after your surgery.  You should have been given your post-op/follow-up appointment when your surgery was scheduled.  If you did not receive a post-op/follow-up appointment, make sure   that you call for this appointment within a day or two after you arrive home to insure a convenient appointment time.  OTHER INSTRUCTIONS  WHEN TO CALL YOUR DOCTOR: Fever over 101.0 Inability to urinate Continued bleeding from incision. Increased pain, redness, or  drainage from the incision. Increasing abdominal pain  The clinic staff is available to answer your questions during regular business hours.  Please don't hesitate to call and ask to speak to one of the nurses for clinical concerns.  If you have a medical emergency, go to the nearest emergency room or call 911.  A surgeon from [facility] is always on call at the hospital. [address], [address], [place], [place]  [address] ? [address] [address], [place], [place]   [address] [phone] ? [phone] ? FAX [phone]   

## 2021-10-17 NOTE — Anesthesia Preprocedure Evaluation (Addendum)
Anesthesia Evaluation  ?Patient identified by MRN, date of birth, ID band ?Patient awake ? ? ? ?Reviewed: ?Allergy & Precautions, NPO status , Patient's Chart, lab work & pertinent test results ? ?Airway ?Mallampati: I ? ?TM Distance: >3 FB ?Neck ROM: Full ? ? ? Dental ?no notable dental hx. ?(+) Dental Advisory Given ?  ?Pulmonary ?neg pulmonary ROS,  ?  ?Pulmonary exam normal ?breath sounds clear to auscultation ? ? ? ? ? ? Cardiovascular ?negative cardio ROS ?Normal cardiovascular exam ?Rhythm:Regular Rate:Normal ? ? ?  ?Neuro/Psych ?negative neurological ROS ?   ? GI/Hepatic ?negative GI ROS, Neg liver ROS,   ?Endo/Other  ?negative endocrine ROS ? Renal/GU ?negative Renal ROS  ? ?  ?Musculoskeletal ?negative musculoskeletal ROS ?(+)  ? Abdominal ?  ?Peds ? Hematology ?negative hematology ROS ?(+)   ?Anesthesia Other Findings ? ? Reproductive/Obstetrics ? ?  ? ? ? ? ? ? ? ? ? ? ? ? ? ?  ?  ? ? ? ? ? ? ? ?Anesthesia Physical ?Anesthesia Plan ? ?ASA: 2 ? ?Anesthesia Plan: General  ? ?Post-op Pain Management: Tylenol PO (pre-op)* and Regional block*  ? ?Induction: Intravenous, Rapid sequence and Cricoid pressure planned ? ?PONV Risk Score and Plan: Ondansetron, Dexamethasone, Treatment may vary due to age or medical condition and Midazolam ? ?Airway Management Planned: Oral ETT ? ?Additional Equipment:  ? ?Intra-op Plan:  ? ?Post-operative Plan: Extubation in OR ? ?Informed Consent: I have reviewed the patients History and Physical, chart, labs and discussed the procedure including the risks, benefits and alternatives for the proposed anesthesia with the patient or authorized representative who has indicated his/her understanding and acceptance.  ? ? ? ?Dental advisory given ? ?Plan Discussed with: CRNA ? ?Anesthesia Plan Comments:   ? ? ? ? ?Anesthesia Quick Evaluation ? ?

## 2021-10-17 NOTE — ED Notes (Signed)
Report given to carelink and phoebe, RN  ?

## 2021-10-17 NOTE — Plan of Care (Signed)
  Problem: Education: Goal: Knowledge of General Education information will improve Description Including pain rating scale, medication(s)/side effects and non-pharmacologic comfort measures Outcome: Progressing   Problem: Health Behavior/Discharge Planning: Goal: Ability to manage health-related needs will improve Outcome: Progressing   

## 2021-10-17 NOTE — Transfer of Care (Signed)
Immediate Anesthesia Transfer of Care Note ? ?Patient: Don Dixon ? ?Procedure(s) Performed: APPENDECTOMY LAPAROSCOPIC (Abdomen) ? ?Patient Location: PACU ? ?Anesthesia Type:GA combined with regional for post-op pain ? ?Level of Consciousness: awake, alert  and oriented ? ?Airway & Oxygen Therapy: Patient Spontanous Breathing and Patient connected to nasal cannula oxygen ? ?Post-op Assessment: Report given to RN and Post -op Vital signs reviewed and stable ? ?Post vital signs: Reviewed and stable ? ?Last Vitals:  ?Vitals Value Taken Time  ?BP 129/51 10/17/21 1139  ?Temp    ?Pulse 95 10/17/21 1142  ?Resp 21 10/17/21 1142  ?SpO2 97 % 10/17/21 1142  ?Vitals shown include unvalidated device data. ? ?Last Pain:  ?Vitals:  ? 10/17/21 0937  ?TempSrc:   ?PainSc: 3   ?   ? ?Patients Stated Pain Goal: 2 (10/17/21 0345) ? ?Complications: No notable events documented. ?

## 2021-10-17 NOTE — Anesthesia Procedure Notes (Signed)
Anesthesia Regional Block: TAP block  ? ?Pre-Anesthetic Checklist: , timeout performed,  Correct Patient, Correct Site, Correct Laterality,  Correct Procedure, Correct Position, site marked,  Risks and benefits discussed,  Surgical consent,  Pre-op evaluation,  At surgeon's request and post-op pain management ? ?Laterality: Right ? ?Prep: chloraprep     ?  ?Needles:  ?Injection technique: Single-shot ? ?Needle Type: Echogenic Stimulator Needle   ? ? ? ?Needle Gauge: 21  ? ? ? ?Additional Needles: ? ? ?Procedures:,,,, ultrasound used (permanent image in chart),,    ?Narrative:  ?Start time: 10/17/2021 9:20 AM ?End time: 10/17/2021 9:40 AM ?Injection made incrementally with aspirations every 5 mL. ? ?Performed by: Personally  ?Anesthesiologist: Nolon Nations, MD ? ?Additional Notes: ?BP cuff, SpO2 and EKG monitors applied. Sedation begun.  Anesthetic injected incrementally, slowly, and after neg aspirations under direct ultrasound guidance. Good fascial spread noted. Patient tolerated well.  ? ? ? ? ?

## 2021-10-17 NOTE — Anesthesia Procedure Notes (Signed)
Procedure Name: Intubation ?Date/Time: 10/17/2021 10:30 AM ?Performed by: Marena Chancy, CRNA ?Pre-anesthesia Checklist: Patient identified, Emergency Drugs available, Suction available and Patient being monitored ?Patient Re-evaluated:Patient Re-evaluated prior to induction ?Oxygen Delivery Method: Circle System Utilized ?Preoxygenation: Pre-oxygenation with 100% oxygen ?Induction Type: IV induction ?Ventilation: Mask ventilation without difficulty ?Laryngoscope Size: Hyacinth Meeker and 2 ?Grade View: Grade I ?Tube type: Oral ?Number of attempts: 1 ?Airway Equipment and Method: Stylet and Oral airway ?Placement Confirmation: ETT inserted through vocal cords under direct vision, positive ETCO2 and breath sounds checked- equal and bilateral ?Tube secured with: Tape ?Dental Injury: Teeth and Oropharynx as per pre-operative assessment  ? ? ? ? ?

## 2021-10-17 NOTE — Plan of Care (Signed)
?  Problem: Education: ?Goal: Knowledge of General Education information will improve ?Description: Including pain rating scale, medication(s)/side effects and non-pharmacologic comfort measures ?10/17/2021 1746 by Karolee Ohs, RN ?Outcome: Adequate for Discharge ?10/17/2021 0758 by Karolee Ohs, RN ?Outcome: Progressing ?  ?Problem: Health Behavior/Discharge Planning: ?Goal: Ability to manage health-related needs will improve ?10/17/2021 1746 by Karolee Ohs, RN ?Outcome: Adequate for Discharge ?10/17/2021 0758 by Karolee Ohs, RN ?Outcome: Progressing ?  ?Problem: Clinical Measurements: ?Goal: Ability to maintain clinical measurements within normal limits will improve ?Outcome: Adequate for Discharge ?Goal: Will remain free from infection ?Outcome: Adequate for Discharge ?Goal: Diagnostic test results will improve ?Outcome: Adequate for Discharge ?Goal: Respiratory complications will improve ?Outcome: Adequate for Discharge ?Goal: Cardiovascular complication will be avoided ?Outcome: Adequate for Discharge ?  ?Problem: Activity: ?Goal: Risk for activity intolerance will decrease ?Outcome: Adequate for Discharge ?  ?Problem: Nutrition: ?Goal: Adequate nutrition will be maintained ?Outcome: Adequate for Discharge ?  ?Problem: Coping: ?Goal: Level of anxiety will decrease ?Outcome: Adequate for Discharge ?  ?Problem: Elimination: ?Goal: Will not experience complications related to bowel motility ?Outcome: Adequate for Discharge ?Goal: Will not experience complications related to urinary retention ?Outcome: Adequate for Discharge ?  ?Problem: Pain Managment: ?Goal: General experience of comfort will improve ?Outcome: Adequate for Discharge ?  ?Problem: Safety: ?Goal: Ability to remain free from injury will improve ?Outcome: Adequate for Discharge ?  ?Problem: Skin Integrity: ?Goal: Risk for impaired skin integrity will decrease ?Outcome: Adequate for Discharge ?  ?

## 2021-10-17 NOTE — H&P (Signed)
Don Dixon is an 36 y.o. male.   ?Chief Complaint: ab pain ?HPI: 45 yof with 24 hours of periumbilical pain migrated to rlq.  Nothing helping at home, voiding, no bm. No fever.  Went to outside er and found to have ct scan with appendicitis and transferred here. ?He has history of [place], is medically retired from [employer]. 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   ? ?History reviewed. No pertinent past medical history. ? ?Past Surgical History:  ?Procedure Laterality Date  ? MANDIBLE SURGERY Right   ? ? ?History reviewed. No pertinent family history. ?Social History:  reports that he has never smoked. He does not have any smokeless tobacco history on file. No history on file for alcohol use and drug use. ? ?Allergies: No Known Allergies ? ?Medications Prior to Admission  ?Medication Sig Dispense Refill  ? ofloxacin (OCUFLOX) 0.3 % ophthalmic solution Se 1-2 drops in right eye every 3-4 hours as directed 5 mL 0  ? ? ?Results for orders placed or performed during the hospital encounter of 10/16/21 (from the past 48 hour(s))  ?Comprehensive metabolic panel     Status: Abnormal  ? Collection Time: 10/16/21  9:20 PM  ?Result Value Ref Range  ? Sodium 138 135 - 145 mmol/L  ? Potassium 4.2 3.5 - 5.1 mmol/L  ? Chloride 104 98 - 111 mmol/L  ? CO2 27 22 - 32 mmol/L  ? Glucose, Bld 96 70 - 99 mg/dL  ?  Comment: Glucose reference range applies only to samples taken after fasting for at least 8 hours.  ? BUN 17 6 - 20 mg/dL  ? Creatinine, Ser 1.27 (H) 0.61 - 1.24 mg/dL  ? Calcium 9.0 8.9 - 10.3 mg/dL  ? Total Protein 7.5 6.5 - 8.1 g/dL  ? Albumin 4.4 3.5 - 5.0 g/dL  ? AST 20 15 - 41 U/L  ? ALT 25 0 - 44 U/L  ? Alkaline Phosphatase 87 38 - 126 U/L  ? Total Bilirubin 0.6 0.3 - 1.2 mg/dL  ? GFR, Estimated >60 >60 mL/min  ?  Comment: (NOTE) ?Calculated using the CKD-EPI Creatinine Equation (2021) ?  ? Anion gap 7 5 - 15  ?  Comment: Performed at [facility], [address] [place], [place] [address]  ?CBC with Differential     Status: Abnormal  ? Collection Time: 10/16/21  9:20 PM  ?Result Value Ref Range  ? WBC 14.6 (H) 4.0 - 10.5 K/uL  ? RBC 4.89 4.22 - 5.81 MIL/uL  ? Hemoglobin 14.8 13.0 - 17.0 g/dL  ? HCT 42.2 39.0 - 52.0 %  ? MCV 86.3 80.0 - 100.0 fL  ? MCH 30.3 26.0 - 34.0 pg  ? MCHC 35.1 30.0 - 36.0 g/dL  ? RDW 12.0 11.5 - 15.5 %  ? Platelets 232 150 - 400 K/uL  ? nRBC 0.0 0.0 - 0.2 %  ? Neutrophils Relative % 79 %  ? Neutro Abs 11.7 (H) 1.7 - 7.7 K/uL  ? Lymphocytes Relative 13 %  ? Lymphs Abs 1.8 0.7 - 4.0 K/uL  ? Monocytes Relative 7 %  ? Monocytes Absolute 1.0 0.1 - 1.0 K/uL  ? Eosinophils Relative 1 %  ? Eosinophils Absolute 0.1 0.0 - 0.5 K/uL  ? Basophils Relative 0 %  ? Basophils Absolute 0.0 0.0 - 0.1 K/uL  ? Immature Granulocytes 0 %  ? Abs Immature Granulocytes  0.03 0.00 - 0.07 K/uL  ?  Comment: Performed at [facility], [address] [place], [place] [address]  ?Lipase, blood     Status: None  ? Collection Time: 10/16/21  9:20 PM  ?Result Value Ref Range  ? Lipase 30 11 - 51 U/L  ?  Comment: Performed at [facility], [address] [place], [place] [address]  ?CBC     Status: Abnormal  ? Collection Time: 10/17/21  4:48 AM  ?Result Value Ref Range  ? WBC 14.5 (H) 4.0 - 10.5 K/uL  ? RBC 4.47 4.22 - 5.81 MIL/uL  ? Hemoglobin 13.4 13.0 - 17.0 g/dL  ? HCT 38.7 (L) 39.0 - 52.0 %  ? MCV 86.6 80.0 - 100.0 fL  ? MCH 30.0 26.0 - 34.0 pg  ? MCHC 34.6 30.0 - 36.0 g/dL  ? RDW 12.1 11.5 - 15.5 %  ? Platelets 178 150 - 400 K/uL  ? nRBC 0.0 0.0 - 0.2 %  ?  Comment: Performed at [facility], [address] [place], [place] [address]  ?Basic metabolic panel     Status: Abnormal  ? Collection Time: 10/17/21  4:48 AM  ?Result Value Ref Range  ? Sodium 135 135 - 145 mmol/L  ? Potassium 3.8 3.5 - 5.1 mmol/L  ? Chloride 103 98 - 111 mmol/L  ? CO2 25 22 - 32 mmol/L  ? Glucose, Bld 116 (H) 70 - 99 mg/dL  ?  Comment:  Glucose reference range applies only to samples taken after fasting for at least 8 hours.  ? BUN 9 6 - 20 mg/dL  ? Creatinine, Ser 1.02 0.61 - 1.24 mg/dL  ? Calcium 8.4 (L) 8.9 - 10.3 mg/dL  ? GFR, Estimated >60 >60 mL/min  ?  Comment: (NOTE) ?Calculated using the CKD-EPI Creatinine Equation (2021) ?  ? Anion gap 7 5 - 15  ?  Comment: Performed at [facility], [address] [place], [place] [address]  ?Surgical pcr screen     Status: None  ? Collection Time: 10/17/21  4:56 AM  ? Specimen: Nasal Mucosa; Nasal Swab  ?Result Value Ref Range  ? MRSA, PCR NEGATIVE NEGATIVE  ? Staphylococcus aureus NEGATIVE NEGATIVE  ?  Comment: (NOTE) ?The Xpert SA Assay (FDA approved for NASAL specimens in patients 7022 ?years of age and older), is one component of a comprehensive ?surveillance program. It is not intended to diagnose infection nor to ?guide or monitor treatment. ?Performed at [facility], [address] [place], [place] [address] ?  ? ?CT ABDOMEN PELVIS W CONTRAST ? ?Result Date: 10/16/2021 ?CLINICAL DATA:  Right-sided abdominal pain, nausea since this morning, right lower quadrant tenderness EXAM: CT ABDOMEN AND PELVIS WITH CONTRAST TECHNIQUE: Multidetector CT imaging of the abdomen and pelvis was performed using the standard protocol following bolus administration of intravenous contrast. RADIATION DOSE REDUCTION: This exam was performed according to the departmental dose-optimization program which includes automated exposure control, adjustment of the mA and/or kV according to patient size and/or use of iterative reconstruction technique. CONTRAST:  100mL OMNIPAQUE IOHEXOL 300 MG/ML  SOLN COMPARISON:  None. FINDINGS: Lower chest: No acute pleural or parenchymal lung disease. Hepatobiliary: Small hepatic hypodensities most compatible with cysts. No biliary duct dilation. The gallbladder is unremarkable. Pancreas: Unremarkable. No pancreatic ductal dilatation or surrounding inflammatory changes. Spleen:  Normal in size without focal abnormality. Adrenals/Urinary Tract: Nonobstructing 2 mm right renal calculus reference image 32/2.  No left-sided calculi. No obstructive uropathy within either kidney. The adrenals and bladder are unremarkable. Stomach/Bowel: Dilated appendix right lower quadrant measures 12 mm, with multiple small appendicoliths and periappendiceal fat stranding consistent with acute appendicitis. No perforation, fluid collection, or abscess. No bowel obstruction or ileus.  Small hiatal hernia. Vascular/Lymphatic: No significant vascular findings. No discrete pathologic adenopathy within the abdomen or pelvis. Reproductive: Prostate is unremarkable. Other: There is a multilobular mass within the central mesentery, and measuring up to 4.0 x 2.9 x 7.8 cm. The mass is multilobular and circumscribed, without associated desmoplastic reaction or bowel retraction. No free fluid or free intraperitoneal gas. No abdominal wall hernia. Musculoskeletal: No acute or destructive bony lesions. Reconstructed images demonstrate no additional findings. IMPRESSION: 1. Acute uncomplicated appendicitis, with multiple appendicoliths. No perforation, fluid collection, or abscess. 2. Nonobstructing 2 mm right renal calculus. 3. 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 No desmoplastic reaction or bowel retraction. This may reflect a calcified fibrous tumor of the mesentery, though carcinoid tumor and lymphoma cannot be excluded. Direct visualization and possible biopsy could be performed time of appendectomy. 4. Small hiatal hernia. Electronically Signed   By: Sharlet Salina M.D.   On: 10/16/2021 22:30   ? ?Review of Systems  ?Gastrointestinal:  Positive for abdominal pain.  ?All other systems reviewed and are negative. ? ?Blood pressure 112/75, pulse 82, temperature 98.3 ?F (36.8 ?C), temperature source Oral, resp. rate 16, weight 90.7 kg, SpO2 91 %. ?Physical Exam ?Constitutional:   ?    Appearance: He is well-developed.  ?HENT:  ?   Mouth/Throat:  ?   Mouth: Mucous membranes are moist.  ?Eyes:  ?   General: No scleral icterus. ?Cardiovascular:  ?   Rate and Rhythm: Normal rate and regular rhythm.

## 2021-10-17 NOTE — Progress Notes (Signed)
Patient arrived back to floor. Pain medication given. Patient resting comfortably. Call bell within reach. ?

## 2021-10-17 NOTE — Progress Notes (Signed)
Walked patient around the unit once. He was able to tolerate a regular diet.  ?Removed IV-CDI. Reviewed d/c paperwork with patient and significant other. Reviewed aftercare instructions. Wheeled stable patient and belongings to pharmacy to pick up medication but the pharmacy was closed. Patient stated that he had tramadol at home and did not want to wait for someone to get the medication for him. Then I wheeled him to the main entrance where he was picked up to d/c to home. ?

## 2021-10-18 ENCOUNTER — Encounter (HOSPITAL_COMMUNITY): Payer: Self-pay | Admitting: General Surgery

## 2021-10-18 LAB — SURGICAL PATHOLOGY

## 2021-10-19 NOTE — Anesthesia Postprocedure Evaluation (Signed)
Anesthesia Post Note ? ?Patient: Don Dixon ? ?Procedure(s) Performed: APPENDECTOMY LAPAROSCOPIC (Abdomen) ? ?  ? ?Patient location during evaluation: PACU ?Anesthesia Type: General ?Level of consciousness: sedated and patient cooperative ?Pain management: pain level controlled ?Vital Signs Assessment: post-procedure vital signs reviewed and stable ?Respiratory status: spontaneous breathing ?Cardiovascular status: stable ?Anesthetic complications: no ? ? ?No notable events documented. ? ?Last Vitals:  ?Vitals:  ? 10/17/21 1249 10/17/21 1601  ?BP: 103/71 124/80  ?Pulse: 91 87  ?Resp: 20 16  ?Temp: 36.9 ?C 36.7 ?C  ?SpO2: 93% 99%  ?  ?Last Pain:  ?Vitals:  ? 10/17/21 1653  ?TempSrc:   ?PainSc: 5   ? ? ?  ?  ?  ?  ?  ?  ? ?[name] ? ? ? ? ?

## 2021-10-21 ENCOUNTER — Other Ambulatory Visit: Payer: Self-pay

## 2021-10-21 ENCOUNTER — Emergency Department (HOSPITAL_COMMUNITY): Payer: No Typology Code available for payment source

## 2021-10-21 ENCOUNTER — Encounter (HOSPITAL_COMMUNITY): Payer: Self-pay

## 2021-10-21 ENCOUNTER — Emergency Department (HOSPITAL_COMMUNITY)
Admission: EM | Admit: 2021-10-21 | Discharge: 2021-10-21 | Disposition: A | Discharge status: Home / Self Care | Payer: No Typology Code available for payment source | Attending: Emergency Medicine | Admitting: Emergency Medicine

## 2021-10-21 DIAGNOSIS — R079 Chest pain, unspecified: Secondary | ICD-10-CM

## 2021-10-21 DIAGNOSIS — R1012 Left upper quadrant pain: Secondary | ICD-10-CM | POA: Insufficient documentation

## 2021-10-21 DIAGNOSIS — N4889 Other specified disorders of penis: Secondary | ICD-10-CM

## 2021-10-21 DIAGNOSIS — R609 Edema, unspecified: Secondary | ICD-10-CM | POA: Insufficient documentation

## 2021-10-21 LAB — URINALYSIS, ROUTINE W REFLEX MICROSCOPIC
Bilirubin Urine: NEGATIVE
Glucose, UA: NEGATIVE mg/dL
Hgb urine dipstick: NEGATIVE
Ketones, ur: NEGATIVE mg/dL
Leukocytes,Ua: NEGATIVE
Nitrite: NEGATIVE
Protein, ur: NEGATIVE mg/dL
Specific Gravity, Urine: 1.014 (ref 1.005–1.030)
pH: 6 (ref 5.0–8.0)

## 2021-10-21 LAB — TROPONIN I (HIGH SENSITIVITY)
Troponin I (High Sensitivity): 4 ng/L (ref <18)
Troponin I (High Sensitivity): 3 ng/L (ref <18)

## 2021-10-21 LAB — BASIC METABOLIC PANEL
Anion gap: 8 (ref 5–15)
BUN: 16 mg/dL (ref 6–20)
CO2: 29 mmol/L (ref 22–32)
Calcium: 10.1 mg/dL (ref 8.9–10.3)
Chloride: 102 mmol/L (ref 98–111)
Creatinine, Ser: 1.25 mg/dL — ABNORMAL HIGH (ref 0.61–1.24)
GFR, Estimated: >60 mL/min (ref >60)
Glucose, Bld: 106 mg/dL — ABNORMAL HIGH (ref 70–99)
Potassium: 4.1 mmol/L (ref 3.5–5.1)
Sodium: 139 mmol/L (ref 135–145)

## 2021-10-21 LAB — CBC
HCT: 43.8 % (ref 39.0–52.0)
Hemoglobin: 15.3 g/dL (ref 13.0–17.0)
MCH: 30.3 pg (ref 26.0–34.0)
MCHC: 34.9 g/dL (ref 30.0–36.0)
MCV: 86.7 fL (ref 80.0–100.0)
Platelets: 265 10*3/uL (ref 150–400)
RBC: 5.05 MIL/uL (ref 4.22–5.81)
RDW: 11.8 % (ref 11.5–15.5)
WBC: 6.9 10*3/uL (ref 4.0–10.5)
nRBC: 0 % (ref 0.0–0.2)

## 2021-10-21 MED ORDER — DOXYCYCLINE HYCLATE 100 MG PO CAPS: 100.0000 mg | ORAL_CAPSULE | Freq: Two times a day (BID) | ORAL | 0 refills | Status: AC

## 2021-10-21 MED ORDER — IOHEXOL 350 MG/ML SOLN
100.0000 mL | Freq: Once | INTRAVENOUS | Status: AC | PRN
  Administered 2021-10-21: 100 mL via INTRAVENOUS

## 2021-10-21 MED ORDER — FLUCONAZOLE 200 MG PO TABS
200.0000 mg | ORAL_TABLET | Freq: Every day | ORAL | 0 refills | Status: DC
  Filled 2021-10-21: qty 7, 7d supply, fill #0

## 2021-10-21 MED ORDER — DOXYCYCLINE HYCLATE 100 MG PO CAPS
100.0000 mg | ORAL_CAPSULE | Freq: Two times a day (BID) | ORAL | 0 refills | Status: DC
  Filled 2021-10-21: qty 20, 10d supply, fill #0

## 2021-10-21 MED ORDER — FLUCONAZOLE 200 MG PO TABS: 200.0000 mg | ORAL_TABLET | Freq: Every day | ORAL | 0 refills | Status: AC

## 2021-10-21 NOTE — ED Notes (Signed)
Patient transported to CT 

## 2021-10-21 NOTE — ED Notes (Signed)
Patient back in room

## 2021-10-21 NOTE — ED Triage Notes (Addendum)
Pt reports starting last night around dinner he started having chest pain. Pt denies sob,N&V. Pt reports this morning the pain was worse and he notice his penis was swollen. Pt reports he thinks he has a blood clot in his penis. Pt denies any drainage or discharge. Pt able to still void ?

## 2021-10-21 NOTE — ED Provider Notes (Signed)
Patient ?[facility] ?Provider Note ? ? ?CSN: [id number] ?Arrival date & time: 10/21/21  [id number] ? ?  ? ?History ? ?Chief Complaint  ?Patient presents with  ? Chest Pain  ? Groin Pain  ? ? ?Don Dixon is a 36 y.o. male. ? ?Patient presents to the emergency department with multiple complaints.  Patient reports that he has been having pain in the left upper abdomen and lower chest area all day.  Pain waxes and wanes but has never really gone completely away.  Patient denies associated shortness of breath, cough.  He has not had any nausea or vomiting but does feel constipated.  No diarrhea.  No fever.  Patient reports that he had appendectomy earlier this week. ? ?In addition to these complaints, patient noted that his penis was swollen this morning when he went to the bathroom.  It has been persistently swollen and painful through the day.  No urethral discharge.  He has noted some redness to the penis. ? ? ?  ? ?Home Medications ?Prior to Admission medications   ?Medication Sig Start Date End Date Taking? Authorizing Provider  ?doxycycline (VIBRAMYCIN) 100 MG capsule Take 1 capsule (100 mg total) by mouth 2 (two) times daily. 10/21/21  Yes Laron Angelini, Gwenyth Allegra, MD  ?fluconazole (DIFLUCAN) 200 MG tablet Take 1 tablet (200 mg total) by mouth daily for 7 days. 10/21/21 10/28/21 Yes Aissata Wilmore, Gwenyth Allegra, MD  ?Docusate Sodium (COLACE PO) Take by mouth.    [provider]  ?naloxone (NARCAN) nasal spray 4 mg/0.1 mL Place 4 mg into the nose as needed. 11/02/19   [provider]  ?omeprazole (PRILOSEC) 20 MG capsule Take 40 mg by mouth in the morning and at bedtime.    [provider]  ?polyethylene glycol (MIRALAX / GLYCOLAX) 17 g packet Take 17 g by mouth daily.    [provider]  ?QUEtiapine (SEROQUEL) 25 MG tablet Take 25 mg by mouth at bedtime. 06/28/20   [provider]  ?tiZANidine (ZANAFLEX) 4 MG tablet Take 4 mg by mouth every 6 (six)  hours as needed for muscle spasms. 05/03/20   [provider]  ?traMADol (ULTRAM) 50 MG tablet Take 2 tablets (100 mg total) by mouth every 6 (six) hours as needed for up to 5 days for moderate pain or severe pain. 10/17/21 10/22/21  Winferd Humphrey, PA-C  ?traMADol (ULTRAM) 50 MG tablet Take 50 mg by mouth every 6 (six) hours as needed for severe pain or moderate pain.    [provider]  ?venlafaxine XR (EFFEXOR-XR) 150 MG 24 hr capsule Take 150 mg by mouth in the morning. 04/04/20   [provider]  ?   ? ?Allergies    ?Patient has no known allergies.   ? ?Review of Systems   ?Review of Systems  ?Cardiovascular:  Positive for chest pain.  ?Gastrointestinal:  Positive for abdominal pain.  ?Genitourinary:  Positive for penile pain and penile swelling.  ? ?Physical Exam ?Updated Vital Signs ?BP 106/78   Pulse (!) 54   Temp (!) 97.5 ?F (36.4 ?C) (Oral)   Resp 18   SpO2 97%  ?Physical Exam ?Vitals and nursing note reviewed.  ?Constitutional:   ?   General: He is not in acute distress. ?   Appearance: He is well-developed.  ?HENT:  ?   Head: Normocephalic and atraumatic.  ?   Mouth/Throat:  ?   Mouth: Mucous membranes are moist.  ?Eyes:  ?  General: Vision grossly intact. Gaze aligned appropriately.  ?   Extraocular Movements: Extraocular movements intact.  ?   Conjunctiva/sclera: Conjunctivae normal.  ?Cardiovascular:  ?   Rate and Rhythm: Normal rate and regular rhythm.  ?   Pulses: Normal pulses.  ?   Heart sounds: Normal heart sounds, S1 normal and S2 normal. No murmur heard. ?  No friction rub. No gallop.  ?Pulmonary:  ?   Effort: Pulmonary effort is normal. No respiratory distress.  ?   Breath sounds: Normal breath sounds.  ?Abdominal:  ?   Palpations: Abdomen is soft.  ?   Tenderness: There is abdominal tenderness in the left upper quadrant. There is no guarding or rebound.  ?   Hernia: No hernia is present. There is no hernia in the left inguinal area or right inguinal area.   ?Genitourinary: ?   Penis: Swelling present. No erythema or lesions.   ?   Testes: Normal.     ?   Right: Mass, tenderness or swelling not present.     ?   Left: Mass, tenderness or swelling not present.  ?   Epididymis:  ?   Right: Normal.  ?   Left: Normal.  ?Musculoskeletal:     ?   General: No swelling.  ?   Cervical back: Full passive range of motion without pain, normal range of motion and neck supple. No pain with movement, spinous process tenderness or muscular tenderness. Normal range of motion.  ?   Right lower leg: No edema.  ?   Left lower leg: No edema.  ?Skin: ?   General: Skin is warm and dry.  ?   Capillary Refill: Capillary refill takes less than 2 seconds.  ?   Findings: No ecchymosis, erythema, lesion or wound.  ?Neurological:  ?   Mental Status: He is alert and oriented to person, place, and time.  ?   GCS: GCS eye subscore is 4. GCS verbal subscore is 5. GCS motor subscore is 6.  ?   Cranial Nerves: Cranial nerves 2-12 are intact.  ?   Sensory: Sensation is intact.  ?   Motor: Motor function is intact. No weakness or abnormal muscle tone.  ?   Coordination: Coordination is intact.  ?Psychiatric:     ?   Mood and Affect: Mood normal.     ?   Speech: Speech normal.     ?   Behavior: Behavior normal.  ? ? ?ED Results / Procedures / Treatments   ?Labs ?(all labs ordered are listed, but only abnormal results are displayed) ?Labs Reviewed  ?BASIC METABOLIC PANEL - Abnormal; Notable for the following components:  ?    Result Value  ? Glucose, Bld 106 (*)   ? Creatinine, Ser 1.25 (*)   ? All other components within normal limits  ?CBC  ?URINALYSIS, ROUTINE W REFLEX MICROSCOPIC  ?TROPONIN I (HIGH SENSITIVITY)  ?TROPONIN I (HIGH SENSITIVITY)  ? ? ?EKG ?None ? ?Radiology ?DG Chest 2 View ? ?Result Date: 10/21/2021 ?CLINICAL DATA:  36 year old male with history of chest pain. EXAM: CHEST - 2 VIEW COMPARISON:  No priors. FINDINGS: Lung volumes are normal. No consolidative airspace disease. No pleural  effusions. No pneumothorax. No pulmonary nodule or mass noted. Pulmonary vasculature and the cardiomediastinal silhouette are within normal limits. IMPRESSION: No radiographic evidence of acute cardiopulmonary disease. Electronically Signed   By: Vinnie Langton M.D.   On: 10/21/2021 05:00  ? ?CT Angio Chest Pulmonary Embolism (PE) W  or WO Contrast ? ?Result Date: 10/21/2021 ?CLINICAL DATA:  36 year old male with history of acute onset of nonlocalized abdominal pain. Clinical suspicion for pulmonary embolism. Patient is status post appendectomy as of 10/17/2021. EXAM: CT ANGIOGRAPHY CHEST CT ABDOMEN AND PELVIS WITH CONTRAST TECHNIQUE: Multidetector CT imaging of the chest was performed using the standard protocol during bolus administration of intravenous contrast. Multiplanar CT image reconstructions and MIPs were obtained to evaluate the vascular anatomy. Multidetector CT imaging of the abdomen and pelvis was performed using the standard protocol during bolus administration of intravenous contrast. RADIATION DOSE REDUCTION: This exam was performed according to the departmental dose-optimization program which includes automated exposure control, adjustment of the mA and/or kV according to patient size and/or use of iterative reconstruction technique. CONTRAST:  159mL OMNIPAQUE IOHEXOL 350 MG/ML SOLN COMPARISON:  CT the abdomen and pelvis 10/16/2021. FINDINGS: CTA CHEST FINDINGS Cardiovascular: No filling defects within the pulmonary arterial tree to suggest pulmonary embolism. Heart size is normal. There is no significant pericardial fluid, thickening or pericardial calcification. No atherosclerotic calcifications are noted in the thoracic aorta or the coronary arteries. Mediastinum/Nodes: No pathologically enlarged mediastinal or hilar lymph nodes. Mild circumferential thickening of the distal third of the esophagus. No axillary lymphadenopathy. Lungs/Pleura: No acute consolidative airspace disease. No pleural  effusions. No suspicious appearing pulmonary nodules or masses are noted. No pneumothorax. Musculoskeletal: There are no aggressive appearing lytic or blastic lesions noted in the visualized portions of the skeleton. Revie

## 2021-10-22 ENCOUNTER — Other Ambulatory Visit (HOSPITAL_COMMUNITY): Payer: Self-pay

## 2021-10-23 NOTE — Discharge Summary (Signed)
[facility] ?Discharge Summary  ? ?Patient ID: ?Don Dixon ?MRN: [id number] ?DOB/AGE: May 22, 1986 36 y.o. ? ?Admit date: 10/16/2021 ?Discharge date: 10/17/2021 ? ?Admitting Diagnosis: ?Appendicitis [K37] ?Acute appendicitis, unspecified acute appendicitis type [K35.80] ?Acute appendicitis [K35.80] ? ? ?Discharge Diagnosis ?Appendicitis s/p laparoscopic appendectomy ? ?Consultants ?None  ? ?Imaging: ?No results found. ? ?Procedures ?Dr. Donne Hazel (10/17/21) - Laparoscopic Appendectomy ? ?Hospital Course:  ?36 year old male who presented to [facility] ED with abdominal pain.  Workup showed appendicitis.  Patient was admitted and underwent procedure listed above.  Tolerated procedure well and was transferred to the floor.  Diet was resumed postoperatively.  On the date of his procedure, the patient was voiding well, tolerating diet, ambulating well, pain well controlled, vital signs stable, incisions c/d/i and felt stable for discharge home.  Patient will follow up in our office in 2-3 weeks and knows to call with questions or concerns.   ? ?Physical Exam: ?General:  Alert, NAD, pleasant, comfortable ?Abd:  Soft, ND, mild tenderness, incisions C/D/I ? ?I or a member of my team have reviewed this patient in the Controlled Substance Database. ? ?Allergies as of 10/17/2021   ?No Known Allergies ?  ? ?  ?Medication List  ?  ? ?ASK your doctor about these medications   ? ?COLACE PO ?Take by mouth. ?  ?naloxone 4 MG/0.1ML Liqd nasal spray kit ?Commonly known as: NARCAN ?Place 4 mg into the nose as needed. ?  ?omeprazole 20 MG capsule ?Commonly known as: PRILOSEC ?Take 40 mg by mouth in the morning and at bedtime. ?  ?polyethylene glycol 17 g packet ?Commonly known as: MIRALAX / GLYCOLAX ?Take 17 g by mouth daily. ?  ?QUEtiapine 25 MG tablet ?Commonly known as: SEROQUEL ?Take 25 mg by mouth at bedtime. ?  ?tiZANidine 4 MG tablet ?Commonly known as: ZANAFLEX ?Take 4 mg by mouth every 6 (six) hours as needed for muscle  spasms. ?  ?traMADol 50 MG tablet ?Commonly known as: ULTRAM ?Take 50 mg by mouth every 6 (six) hours as needed for severe pain or moderate pain. ?Ask about: Which instructions should I use? ?  ?traMADol 50 MG tablet ?Commonly known as: Ultram ?Take 2 tablets (100 mg total) by mouth every 6 (six) hours as needed for up to 5 days for moderate pain or severe pain. ?Ask about: Should I take this medication? ?  ?venlafaxine XR 150 MG 24 hr capsule ?Commonly known as: EFFEXOR-XR ?Take 150 mg by mouth in the morning. ?  ? ?  ? ? ? ? Follow-up Information   ? ? [facility], [place]. Go on 10/17/2021.   ?Specialty: General Surgery ?Why: follow up with PA in clinic on 11/06/21 at 11:30 am  ?Please arrive 30 minutes prior to your appointment to check in and fill out paperwork. Bring photo ID and insurance information. ?Contact information: ?[address] ?[address] ?[place] ?[phone] ? ?  ?  ? ?  ?  ? ?  ? ? ?Signed: ?Winferd Humphrey , PA-C ?[facility] ?10/23/2021, 2:20 PM ?Please see Amion for pager number during day hours 7:00am-4:30pm ? ? ? ?

## 2021-11-30 ENCOUNTER — Ambulatory Visit (HOSPITAL_COMMUNITY)
Admission: RE | Admit: 2021-11-30 | Discharge: 2021-11-30 | Disposition: A | Discharge status: Home / Self Care | Payer: No Typology Code available for payment source | Source: Ambulatory Visit | Attending: Student | Admitting: Student

## 2021-11-30 ENCOUNTER — Other Ambulatory Visit (HOSPITAL_COMMUNITY): Payer: Self-pay | Admitting: Student

## 2021-11-30 ENCOUNTER — Other Ambulatory Visit: Payer: Self-pay | Admitting: Student

## 2021-11-30 DIAGNOSIS — R109 Unspecified abdominal pain: Secondary | ICD-10-CM | POA: Insufficient documentation

## 2021-11-30 MED ORDER — IOHEXOL 9 MG/ML PO SOLN
ORAL | Status: AC
  Filled 2021-11-30: qty 1000

## 2021-11-30 MED ORDER — IOHEXOL 300 MG/ML  SOLN
100.0000 mL | Freq: Once | INTRAMUSCULAR | Status: AC | PRN
  Administered 2021-11-30: 100 mL via INTRAVENOUS

## 2022-06-11 ENCOUNTER — Other Ambulatory Visit (HOSPITAL_BASED_OUTPATIENT_CLINIC_OR_DEPARTMENT_OTHER): Payer: Self-pay

## 2023-02-15 IMAGING — CR DG CHEST 2V
2 series · 2 of 2 positions shown · non-contrast
Comparison: No priors.

CLINICAL DATA: 36-year-old male with history of chest pain.

EXAM:
CHEST - 2 VIEW

[chest pa]
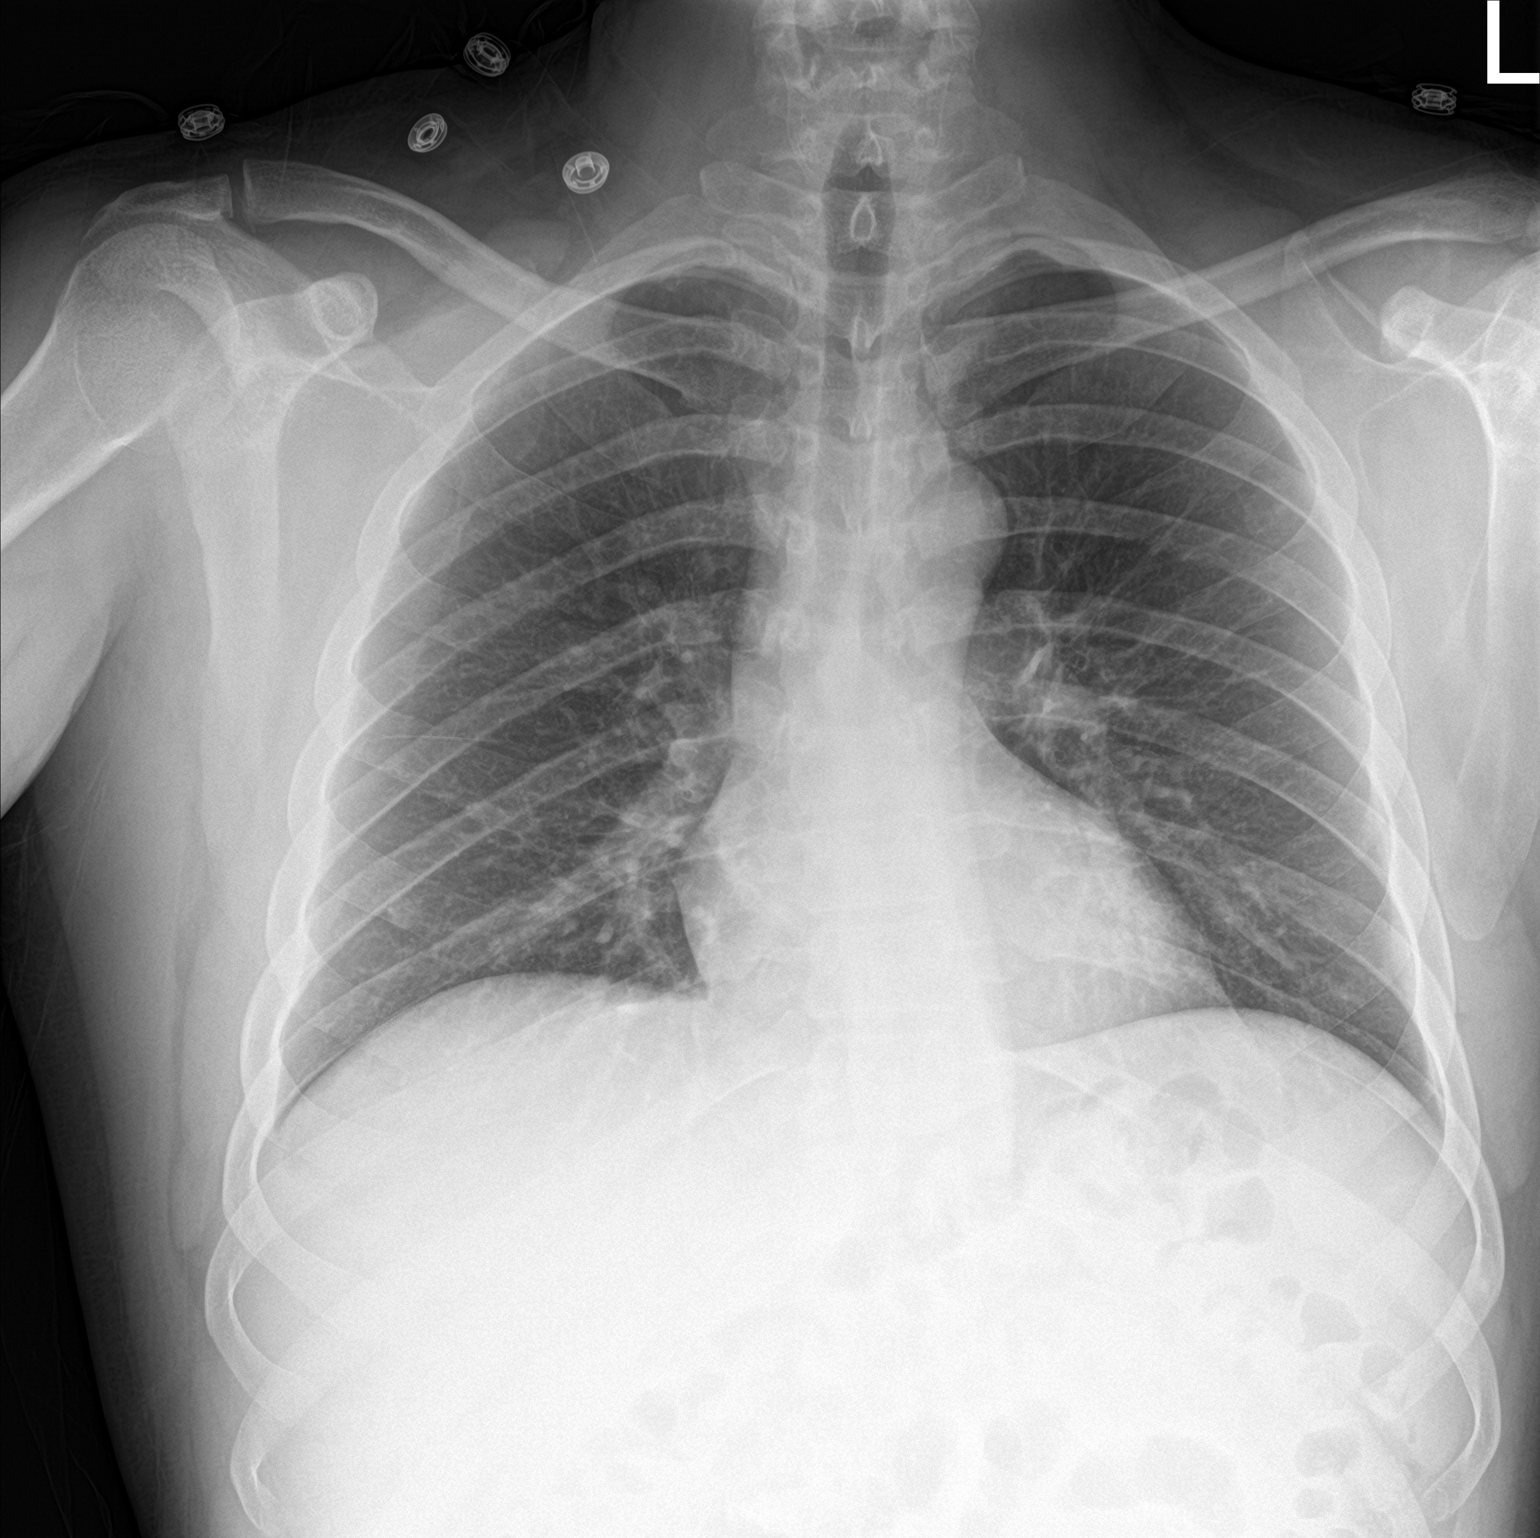

[chest lat]
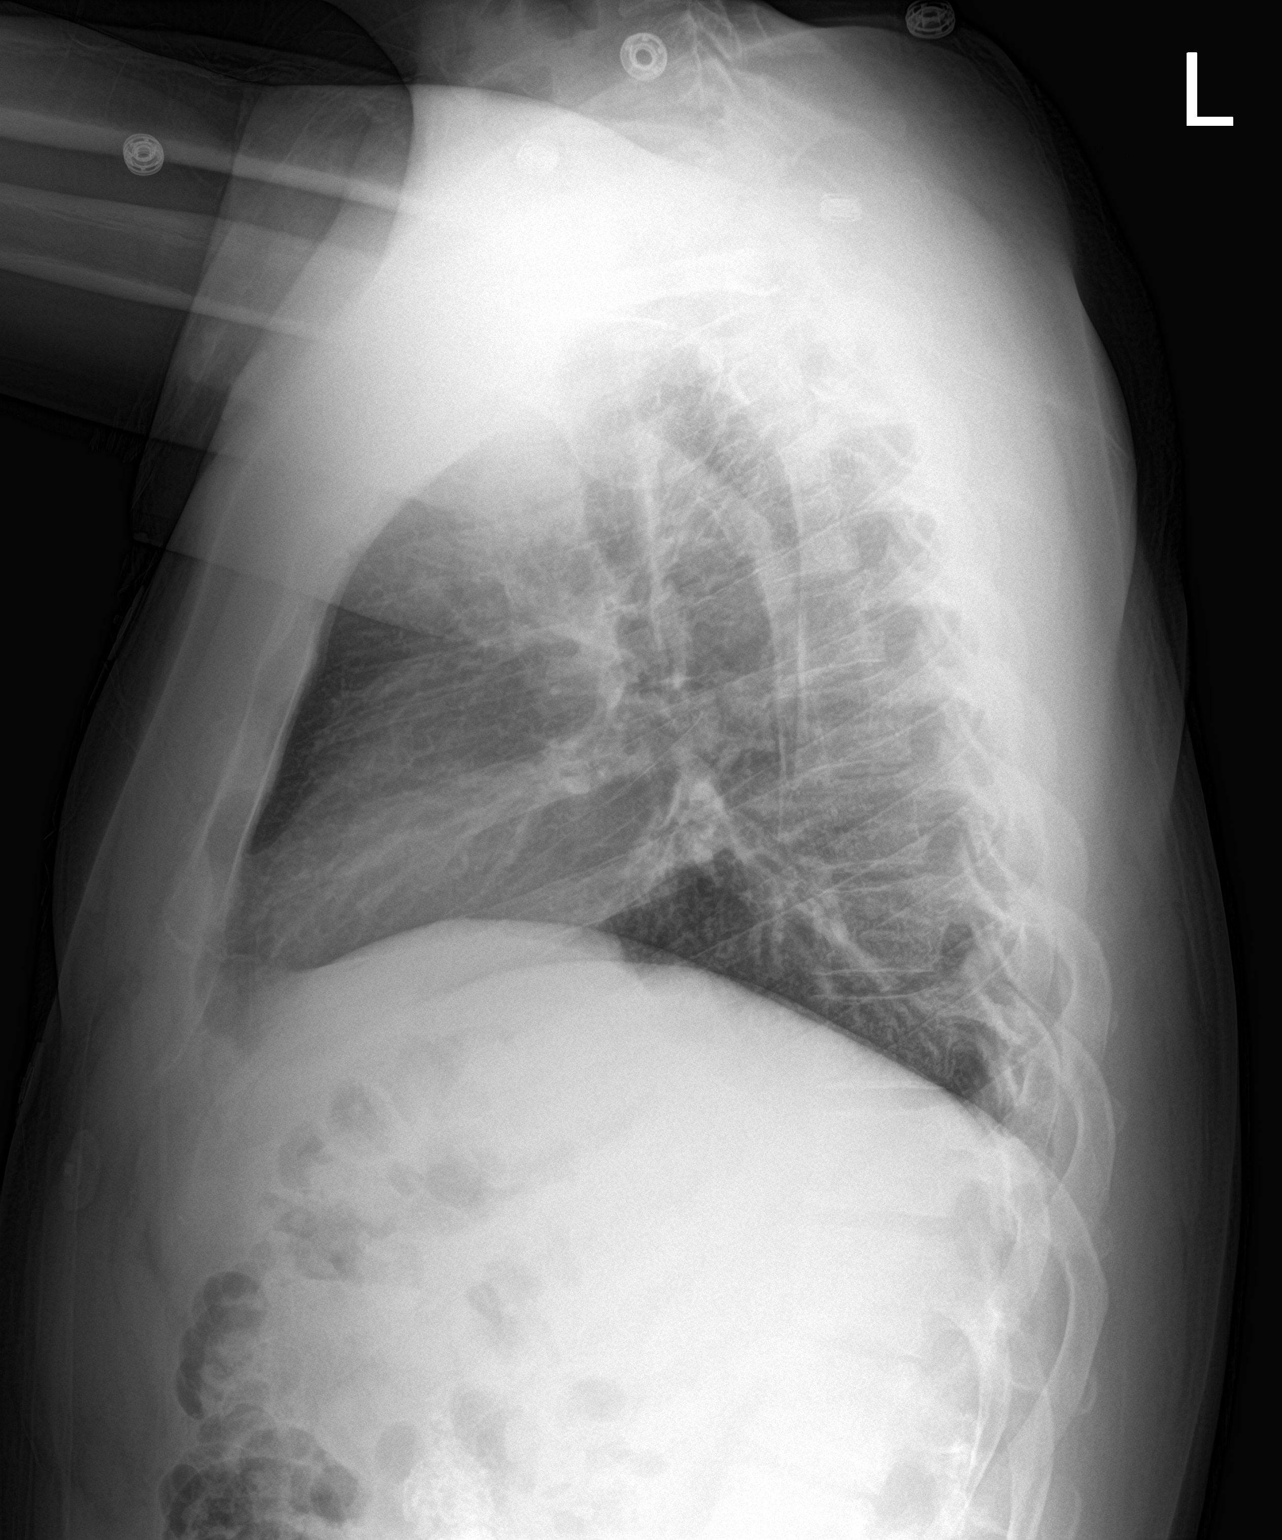

[2 of 2 positions shown; findings below may reference images not displayed]

FINDINGS: Lung volumes are normal. No consolidative airspace disease. No
pleural effusions. No pneumothorax. No pulmonary nodule or mass
noted. Pulmonary vasculature and the cardiomediastinal silhouette
are within normal limits.
IMPRESSION: No radiographic evidence of acute cardiopulmonary disease.

## 2023-03-27 IMAGING — CT CT ABD-PELV W/ CM
2 of 4 series · 15 of 46 positions shown, 17 images · IV contrast (APPLIED)
Comparison: CT examination dated October 21, 2021

CLINICAL DATA: Status post laparoscopic appendectomy on 10/17/2021.
Presenting with 1 week right lower quadrant pain. Rule out postop
complications.

EXAM:
CT ABDOMEN AND PELVIS WITH CONTRAST
TECHNIQUE: Multidetector CT imaging of the abdomen and pelvis was performed
using the standard protocol following bolus administration of
intravenous contrast.

[Series 2: axial st · axial · 0.95mm/px · z∈[-548,-88]mm · 12 of 104 slices shown, 14 images]
[im 6/104  soft-tissue]
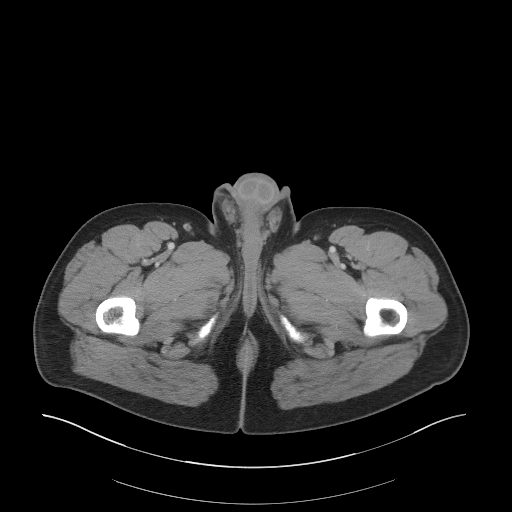
[im 6/104  bone]
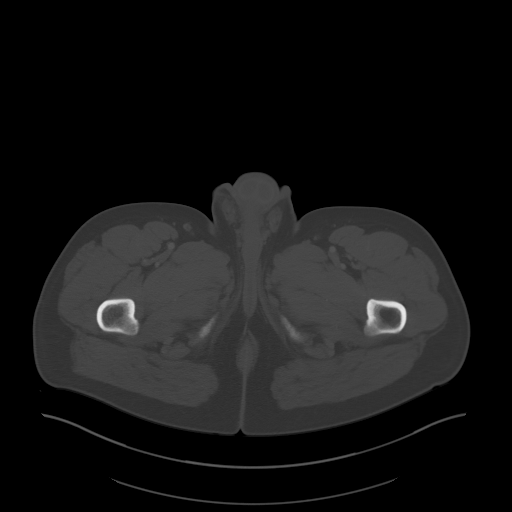
[im 17/104  soft-tissue]
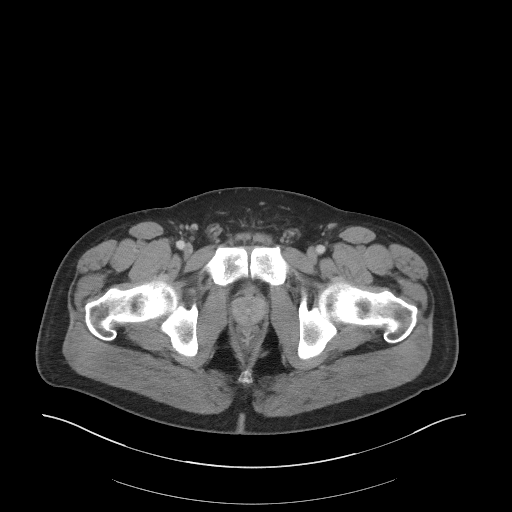
[im 22/104  soft-tissue]
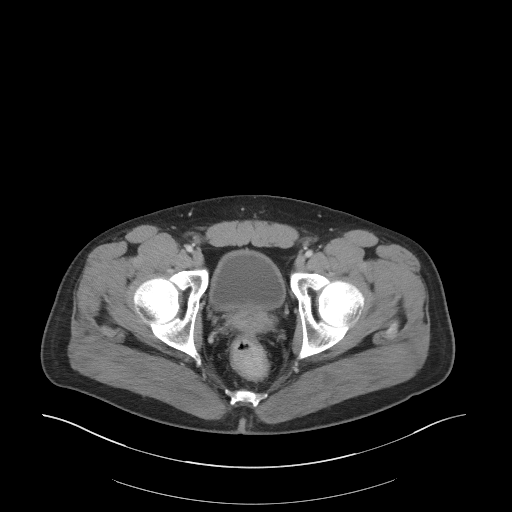
[im 33/104  soft-tissue]
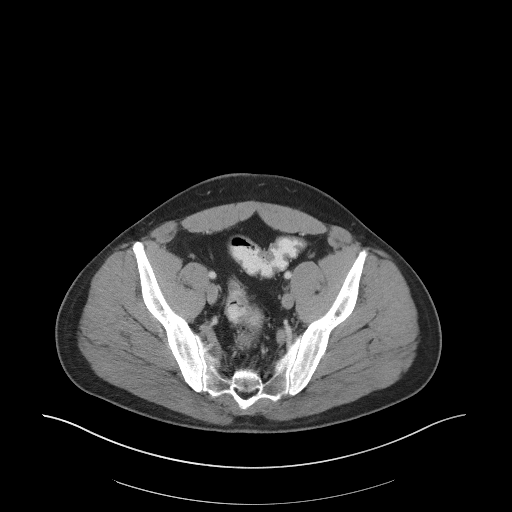
[im 38/104  soft-tissue]
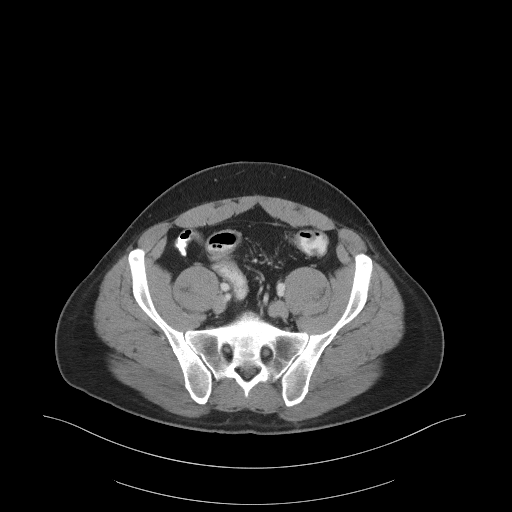
[im 49/104  soft-tissue]
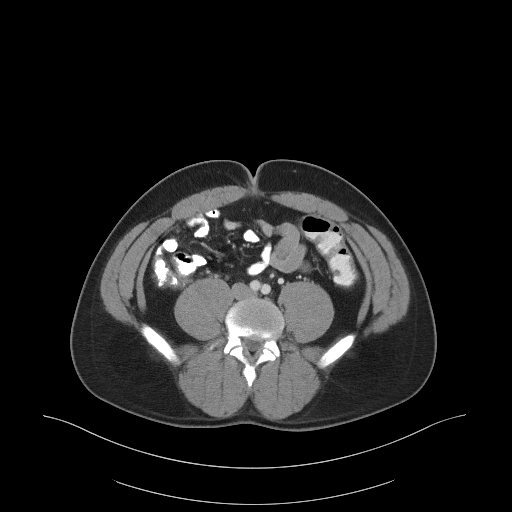
[im 55/104  soft-tissue]
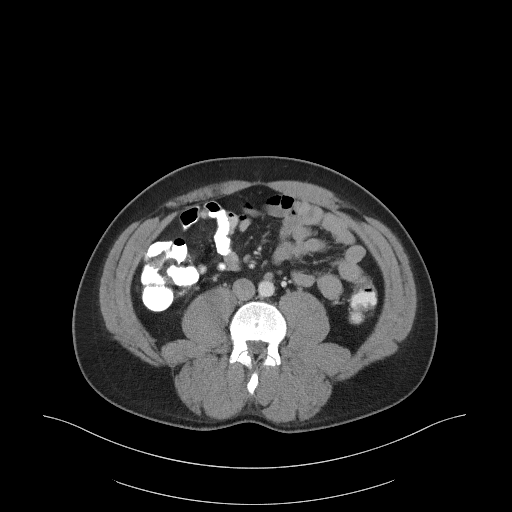
[im 66/104  soft-tissue]
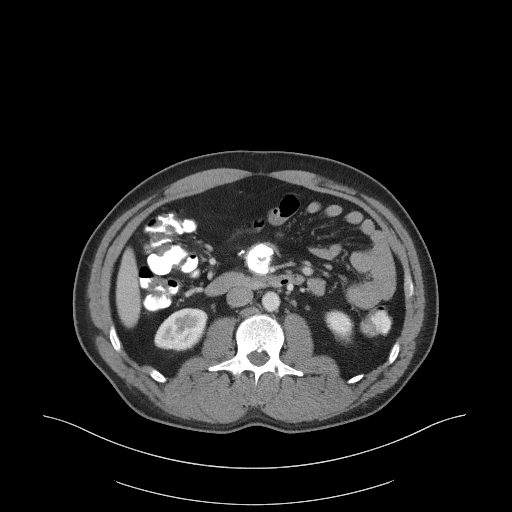
[im 71/104  soft-tissue]
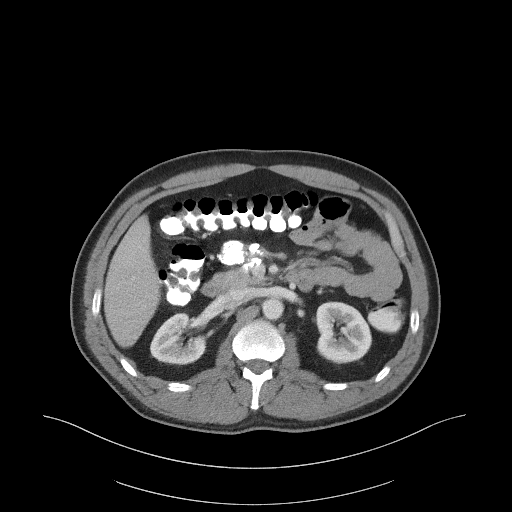
[im 71/104  bone]
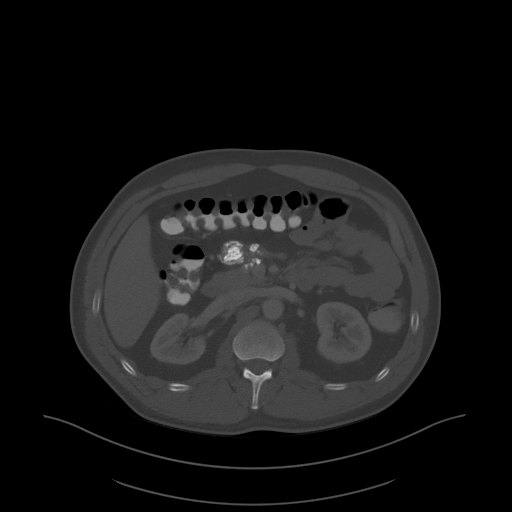
[im 82/104  soft-tissue]
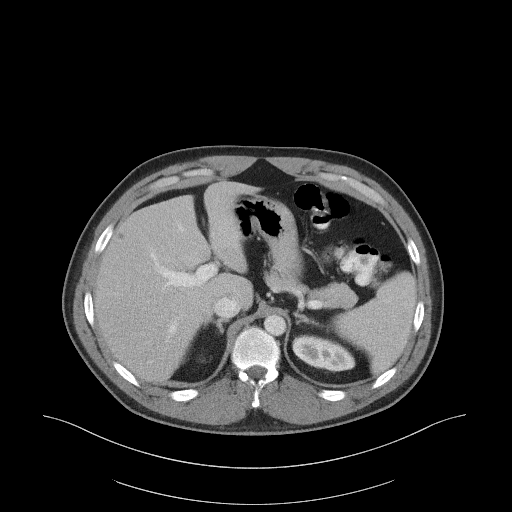
[im 87/104  soft-tissue]
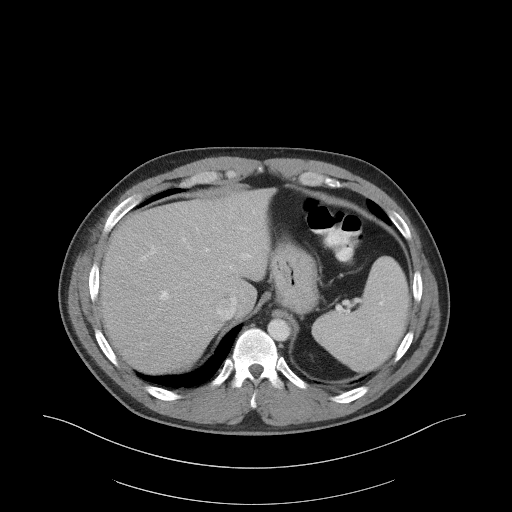
[im 98/104  soft-tissue]
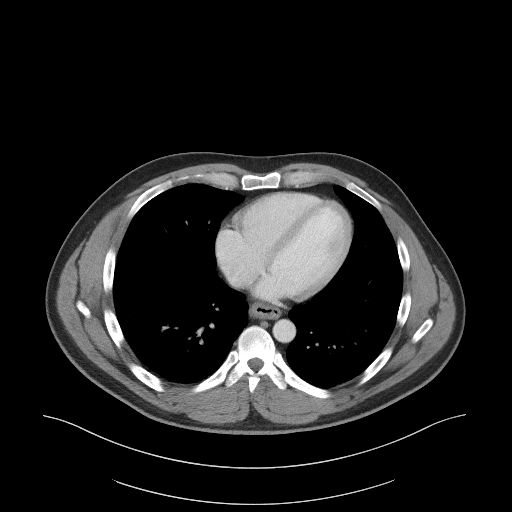

[Series 5: coronal st · coronal · 0.70mm/px · 3 of 101 slices shown]
[im 34/101  soft-tissue]
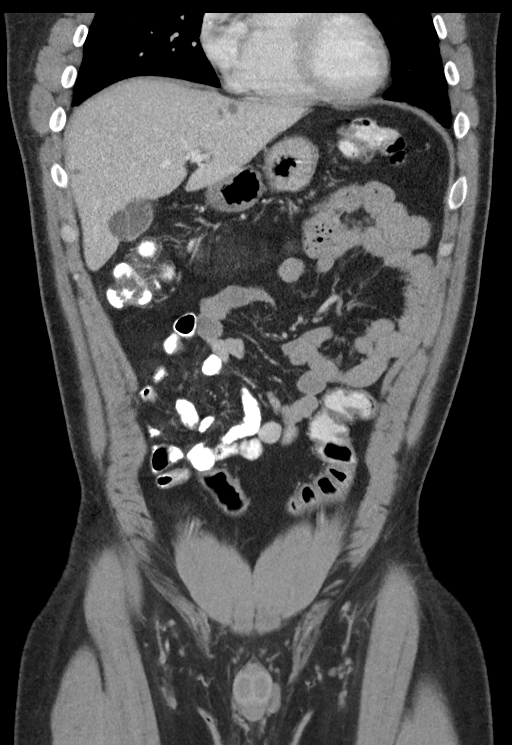
[im 45/101  soft-tissue]
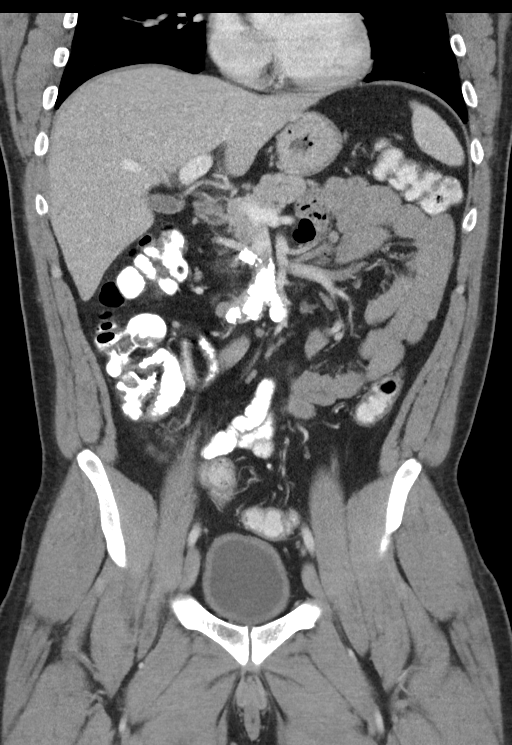
[im 56/101  soft-tissue]
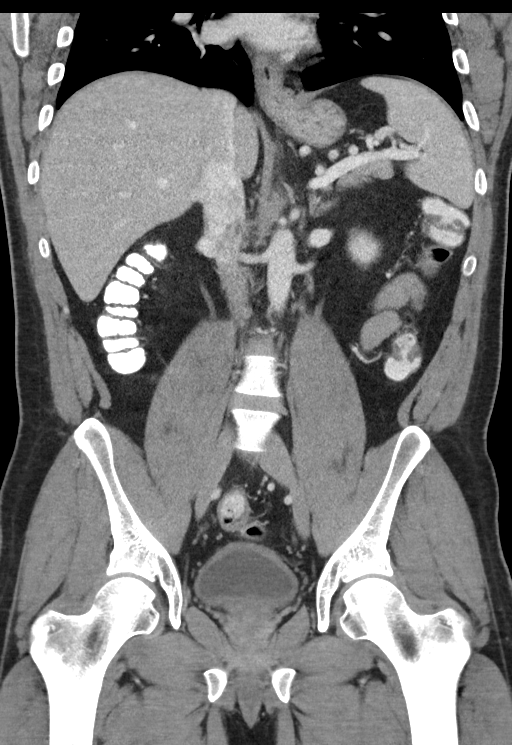

[15 of 46 positions shown; findings below may reference images not displayed]

RADIATION DOSE REDUCTION: This exam was performed according to the
departmental dose-optimization program which includes automated
exposure control, adjustment of the mA and/or kV according to
patient size and/or use of iterative reconstruction technique.

CONTRAST:  100mL OMNIPAQUE IOHEXOL 300 MG/ML  SOLN
FINDINGS: Lower chest: No acute abnormality.

Hepatobiliary: No focal liver abnormality is seen. No gallstones,
gallbladder wall thickening, or biliary dilatation.

Pancreas: Unremarkable. No pancreatic ductal dilatation or
surrounding inflammatory changes.

Spleen: Normal in size without focal abnormality.

Adrenals/Urinary Tract: Adrenal glands are unremarkable. Punctate
calculus in the lower pole of the right kidney. No evidence of
hydronephrosis or ureteral calculus. Bladder is unremarkable.

Stomach/Bowel: Stomach is within normal limits. Status post recent
appendectomy. No evidence of bowel wall thickening, distention, or
inflammatory changes.

Vascular/Lymphatic: Prominent calcified nodes in the mesentery,
unchanged, likely sequela of prior infectious/inflammatory process.

Reproductive: Prostate is unremarkable.

Other: Interval near complete resolution of the small soft tissue
collection in anterior to the right psoas muscle which was likely
postsurgical collection.

Musculoskeletal: No acute or significant osseous findings.
IMPRESSION: 1. Interval near complete resolution of the small fluid collection
anterior to the right psoas muscle, which was likely postsurgical.
No abdominal collection or abscess.

2. Stable chronic calcified mesenteric lymph nodes, likely sequela
of prior infectious/inflammatory process.

3. Punctate calculus in the lower pole of the right kidney without
evidence of hydronephrosis or ureteral calculus.
